# Patient Record
Sex: Male | Born: 1935 | Race: White | Hispanic: No | Marital: Married | Smoking: Former smoker
Health system: Southern US, Community
[De-identification: ages and names within clinical notes are randomized; demographics above are authoritative.]

## PROBLEM LIST (undated history)

## (undated) ENCOUNTER — Inpatient Hospital Stay: Payer: Self-pay | Admitting: Hematology and Oncology

## (undated) DIAGNOSIS — K219 Gastro-esophageal reflux disease without esophagitis: Secondary | ICD-10-CM

## (undated) DIAGNOSIS — R0602 Shortness of breath: Secondary | ICD-10-CM

## (undated) DIAGNOSIS — J189 Pneumonia, unspecified organism: Secondary | ICD-10-CM

## (undated) DIAGNOSIS — R5383 Other fatigue: Secondary | ICD-10-CM

## (undated) DIAGNOSIS — R51 Headache: Secondary | ICD-10-CM

## (undated) DIAGNOSIS — J45909 Unspecified asthma, uncomplicated: Secondary | ICD-10-CM

## (undated) DIAGNOSIS — G4730 Sleep apnea, unspecified: Secondary | ICD-10-CM

## (undated) DIAGNOSIS — K279 Peptic ulcer, site unspecified, unspecified as acute or chronic, without hemorrhage or perforation: Secondary | ICD-10-CM

## (undated) DIAGNOSIS — I2510 Atherosclerotic heart disease of native coronary artery without angina pectoris: Secondary | ICD-10-CM

## (undated) DIAGNOSIS — N400 Enlarged prostate without lower urinary tract symptoms: Secondary | ICD-10-CM

## (undated) DIAGNOSIS — D72819 Decreased white blood cell count, unspecified: Secondary | ICD-10-CM

## (undated) DIAGNOSIS — N189 Chronic kidney disease, unspecified: Secondary | ICD-10-CM

## (undated) DIAGNOSIS — I10 Essential (primary) hypertension: Secondary | ICD-10-CM

## (undated) DIAGNOSIS — D539 Nutritional anemia, unspecified: Secondary | ICD-10-CM

## (undated) DIAGNOSIS — E538 Deficiency of other specified B group vitamins: Secondary | ICD-10-CM

## (undated) DIAGNOSIS — Z8719 Personal history of other diseases of the digestive system: Secondary | ICD-10-CM

## (undated) DIAGNOSIS — I499 Cardiac arrhythmia, unspecified: Secondary | ICD-10-CM

## (undated) DIAGNOSIS — D649 Anemia, unspecified: Secondary | ICD-10-CM

## (undated) DIAGNOSIS — J449 Chronic obstructive pulmonary disease, unspecified: Secondary | ICD-10-CM

## (undated) DIAGNOSIS — H409 Unspecified glaucoma: Secondary | ICD-10-CM

## (undated) DIAGNOSIS — M1990 Unspecified osteoarthritis, unspecified site: Secondary | ICD-10-CM

## (undated) DIAGNOSIS — E7800 Pure hypercholesterolemia, unspecified: Secondary | ICD-10-CM

## (undated) HISTORY — DX: Decreased white blood cell count, unspecified: D72.819

## (undated) HISTORY — PX: EYE SURGERY: SHX253

## (undated) HISTORY — PX: NASAL POLYP SURGERY: SHX186

## (undated) HISTORY — DX: Atherosclerotic heart disease of native coronary artery without angina pectoris: I25.10

## (undated) HISTORY — PX: TRANSURETHRAL RESECTION OF PROSTATE: SHX73

## (undated) HISTORY — DX: Other fatigue: R53.83

## (undated) HISTORY — DX: Benign prostatic hyperplasia without lower urinary tract symptoms: N40.0

## (undated) HISTORY — DX: Nutritional anemia, unspecified: D53.9

## (undated) HISTORY — DX: Deficiency of other specified B group vitamins: E53.8

---

## 1997-05-14 DIAGNOSIS — I2510 Atherosclerotic heart disease of native coronary artery without angina pectoris: Secondary | ICD-10-CM

## 1997-05-14 HISTORY — DX: Atherosclerotic heart disease of native coronary artery without angina pectoris: I25.10

## 1998-08-09 ENCOUNTER — Ambulatory Visit (HOSPITAL_COMMUNITY): Admission: RE | Admit: 1998-08-09 | Discharge: 1998-08-09 | Payer: Self-pay

## 1999-10-31 ENCOUNTER — Encounter: Payer: Self-pay | Admitting: Otolaryngology

## 1999-10-31 ENCOUNTER — Encounter: Admission: RE | Admit: 1999-10-31 | Discharge: 1999-10-31 | Payer: Self-pay | Admitting: Otolaryngology

## 1999-11-02 ENCOUNTER — Encounter (INDEPENDENT_AMBULATORY_CARE_PROVIDER_SITE_OTHER): Payer: Self-pay | Admitting: *Deleted

## 1999-11-02 ENCOUNTER — Ambulatory Visit (HOSPITAL_BASED_OUTPATIENT_CLINIC_OR_DEPARTMENT_OTHER): Admission: RE | Admit: 1999-11-02 | Discharge: 1999-11-02 | Payer: Self-pay | Admitting: Otolaryngology

## 1999-12-06 ENCOUNTER — Ambulatory Visit (HOSPITAL_COMMUNITY): Admission: RE | Admit: 1999-12-06 | Discharge: 1999-12-07 | Payer: Self-pay | Admitting: Cardiovascular Disease

## 1999-12-06 HISTORY — PX: CORONARY ANGIOPLASTY: SHX604

## 1999-12-20 ENCOUNTER — Inpatient Hospital Stay (HOSPITAL_COMMUNITY): Admission: EM | Admit: 1999-12-20 | Discharge: 1999-12-22 | Payer: Self-pay | Admitting: *Deleted

## 1999-12-21 ENCOUNTER — Encounter: Payer: Self-pay | Admitting: Internal Medicine

## 2003-03-21 ENCOUNTER — Emergency Department (HOSPITAL_COMMUNITY): Admission: EM | Admit: 2003-03-21 | Discharge: 2003-03-21 | Payer: Self-pay | Admitting: Emergency Medicine

## 2007-08-29 ENCOUNTER — Ambulatory Visit: Payer: Self-pay | Admitting: Cardiovascular Disease

## 2007-09-02 ENCOUNTER — Ambulatory Visit: Payer: Self-pay

## 2009-05-14 HISTORY — PX: ESOPHAGOGASTRODUODENOSCOPY: SHX1529

## 2009-07-04 ENCOUNTER — Encounter: Payer: Self-pay | Admitting: Gastroenterology

## 2009-07-07 ENCOUNTER — Encounter (INDEPENDENT_AMBULATORY_CARE_PROVIDER_SITE_OTHER): Payer: Self-pay | Admitting: *Deleted

## 2009-08-31 ENCOUNTER — Ambulatory Visit: Payer: Self-pay | Admitting: Gastroenterology

## 2009-08-31 DIAGNOSIS — H402290 Chronic angle-closure glaucoma, unspecified eye, stage unspecified: Secondary | ICD-10-CM | POA: Insufficient documentation

## 2009-08-31 DIAGNOSIS — I2510 Atherosclerotic heart disease of native coronary artery without angina pectoris: Secondary | ICD-10-CM | POA: Insufficient documentation

## 2009-08-31 DIAGNOSIS — D6489 Other specified anemias: Secondary | ICD-10-CM

## 2009-08-31 DIAGNOSIS — G4730 Sleep apnea, unspecified: Secondary | ICD-10-CM | POA: Insufficient documentation

## 2009-08-31 DIAGNOSIS — E119 Type 2 diabetes mellitus without complications: Secondary | ICD-10-CM

## 2009-09-12 ENCOUNTER — Telehealth: Payer: Self-pay | Admitting: Gastroenterology

## 2009-09-13 ENCOUNTER — Ambulatory Visit: Payer: Self-pay | Admitting: Gastroenterology

## 2009-09-15 ENCOUNTER — Encounter: Payer: Self-pay | Admitting: Gastroenterology

## 2009-09-21 ENCOUNTER — Telehealth: Payer: Self-pay | Admitting: Gastroenterology

## 2009-09-22 ENCOUNTER — Ambulatory Visit: Payer: Self-pay | Admitting: Gastroenterology

## 2009-09-22 ENCOUNTER — Ambulatory Visit: Payer: Self-pay | Admitting: Internal Medicine

## 2009-09-22 DIAGNOSIS — M1389 Other specified arthritis, multiple sites: Secondary | ICD-10-CM

## 2009-09-22 DIAGNOSIS — M129 Arthropathy, unspecified: Secondary | ICD-10-CM | POA: Insufficient documentation

## 2009-09-22 DIAGNOSIS — K625 Hemorrhage of anus and rectum: Secondary | ICD-10-CM

## 2009-09-22 LAB — CONVERTED CEMR LAB
Basophils Absolute: 0 10*3/uL (ref 0.0–0.1)
Basophils Relative: 0.4 % (ref 0.0–3.0)
Eosinophils Absolute: 0.1 10*3/uL (ref 0.0–0.7)
Eosinophils Relative: 2.2 % (ref 0.0–5.0)
HCT: 32.2 % — ABNORMAL LOW (ref 39.0–52.0)
Hemoglobin: 11 g/dL — ABNORMAL LOW (ref 13.0–17.0)
Lymphocytes Relative: 22.8 % (ref 12.0–46.0)
Lymphs Abs: 1.3 10*3/uL (ref 0.7–4.0)
MCHC: 34.1 g/dL (ref 30.0–36.0)
MCV: 96.3 fL (ref 78.0–100.0)
Monocytes Absolute: 0.7 10*3/uL (ref 0.1–1.0)
Monocytes Relative: 13.6 % — ABNORMAL HIGH (ref 3.0–12.0)
Neutro Abs: 3.4 10*3/uL (ref 1.4–7.7)
Neutrophils Relative %: 61 % (ref 43.0–77.0)
Platelets: 271 10*3/uL (ref 150.0–400.0)
RBC: 3.35 M/uL — ABNORMAL LOW (ref 4.22–5.81)
RDW: 13.5 % (ref 11.5–14.6)
WBC: 5.5 10*3/uL (ref 4.5–10.5)

## 2009-09-23 ENCOUNTER — Telehealth: Payer: Self-pay | Admitting: Nurse Practitioner

## 2009-10-04 ENCOUNTER — Telehealth: Payer: Self-pay | Admitting: Nurse Practitioner

## 2010-05-14 HISTORY — PX: COLONOSCOPY: SHX174

## 2010-06-13 NOTE — Letter (Signed)
Summary: Patient Notice-Hyperplastic Polyps  [facility]  [address] [place], [place] [address]   Phone: [phone]  Fax: [phone]        Sep 15, 2009 MRN: [id number]    Collin Campbell [address] [place], [place]  [address]    Dear [name],  I am pleased to inform you that the colon polyp(s) removed during your recent colonoscopy was (were) found to be hyperplastic.  These types of polyps are NOT pre-cancerous.  It is therefore my recommendation that you have a repeat colonoscopy examination in _ years for routine colorectal cancer screening.  Should you develop new or worsening symptoms of abdominal pain, bowel habit changes or bleeding from the rectum or bowels, please schedule an evaluation with either your primary care physician or with me.  Additional information/recommendations:  _x_No further action with gastroenterology is needed at this time.      Please follow-up with your primary care physician for your other      healthcare needs. __Please call [phone] to schedule a return visit to review      your situation.  __Please keep your follow-up visit as already scheduled.  __Continue treatment plan as outlined the day of your exam.  Please call us if you are having persistent problems or have questions about your condition that have not been fully answered at this time.  Sincerely,  Louis Meckel MD This letter has been electronically signed by your physician.  Appended Document: Patient Notice-Hyperplastic Polyps letter mailed 5.10.11

## 2010-06-13 NOTE — Progress Notes (Signed)
Summary: TRIAGE-Bleeding  Phone Note Call from Patient   Caller: Patient Call For: DR.Arend Bahl Summary of Call: Pt. had a Colon/Endo. on 09-13-09. He states he passes rectal blood everyday, states he has been bleeding since before his Colonoscopy. He declines an appointment.   [facility] PLEASE ADVISE  Initial call taken by: Laureen Ochs LPN,  Sep 21, 2009 3:49 PM  Follow-up for Phone Call        Pt. calling back, states he now has abd. pain/burning. He will come for labs and to see Willette Cluster NP 09-22-09 at 2pm. If symptoms become worse call back immediately or go to ER.  Follow-up by: Laureen Ochs LPN,  Sep 21, 2009 4:57 PM

## 2010-06-13 NOTE — Letter (Signed)
Summary: Results Letter  [facility]  [address] [place], [place] [address]   Phone: [phone]  Fax: [phone]        August 31, 2009 MRN: [id number]    Collin Campbell [address] [place], [place]  [address]    Dear Mr. KOHLMEYER,  It is my pleasure to have treated you recently as a new patient in my office. I appreciate your confidence and the opportunity to participate in your care.  Since I do have a busy inpatient endoscopy schedule and office schedule, my office hours vary weekly. I am, however, available for emergency calls everyday through my office. If I am not available for an urgent office appointment, another one of our gastroenterologist will be able to assist you.  My well-trained staff are prepared to help you at all times. For emergencies after office hours, a physician from our Gastroenterology section is always available through my 24 hour answering service  Once again I welcome you as a new patient and I look forward to a happy and healthy relationship             Sincerely,  Louis Meckel MD  This letter has been electronically signed by your physician.  Appended Document: Results Letter mailed

## 2010-06-13 NOTE — Procedures (Signed)
Summary: Upper Endoscopy  Patient: Collin Campbell Note: All result statuses are Final unless otherwise noted.  Tests: (1) Upper Endoscopy (EGD)   EGD Upper Endoscopy       DONE     [facility]     [address]     [place], [place]  [address]           ENDOSCOPY PROCEDURE REPORT           PATIENT:  Collin Campbell, Collin Campbell  MR#:  [id number]     BIRTHDATE:  1926-02-23, 83 yrs. old  GENDER:  male           ENDOSCOPIST:  Barbette Hair. Arlyce Dice, MD     Referred by:           PROCEDURE DATE:  09/13/2009     PROCEDURE:  EGD, diagnostic     ASA CLASS:  Class III     INDICATIONS:  anemia Pt takes mobic regularly           MEDICATIONS:   There was residual sedation effect present from     prior procedure., Versed 1 mg IV, glycopyrrolate (Robinal) 0.2 mg     IV, 0.6cc simethancone 0.6 cc PO     TOPICAL ANESTHETIC:  Exactacain Spray           DESCRIPTION OF PROCEDURE:   After the risks benefits and     alternatives of the procedure were thoroughly explained, informed     consent was obtained.  The LB GIF-H180 [id number] endoscope was     introduced through the mouth and advanced to the third portion of     the duodenum, without limitations.  The instrument was slowly     withdrawn as the mucosa was fully examined.     <<PROCEDUREIMAGES>>           The upper, middle, and distal third of the esophagus were     carefully inspected and no abnormalities were noted. The z-line     was well seen at the GEJ. The endoscope was pushed into the fundus     which was normal including a retroflexed view. The antrum,gastric     body, first and second part of the duodenum were unremarkable (see     image1, image2, image3, image4, image6, image7, and image9).     Retroflexed views revealed no abnormalities.    The scope was then     withdrawn from the patient and the procedure completed.     COMPLICATIONS:  None           ENDOSCOPIC IMPRESSION:     1) Normal EGD           RECOMMENDATIONS:Okay to  continue mobic, provided that blood counts     remain stable (pt tested hemeoccult negative)           REPEAT EXAM:  No           ______________________________     Barbette Hair. Arlyce Dice, MD           CC:  Rudi Heap, MD           n.     Rosalie Doctor:   Barbette Hair. Fleetwood Pierron at 09/13/2009 03:57 PM           Judith Blonder, [phone]  Note: An exclamation mark (!) indicates a result that was not dispersed into the flowsheet. Document Creation Date: 09/13/2009 3:57 PM _______________________________________________________________________  (1) Order result status: Final Collection or observation  date-time: 09/13/2009 15:41 Requested date-time:  Receipt date-time:  Reported date-time:  Referring Physician:   Ordering Physician: Melvia Heaps [phone]) Specimen Source:  Source: Launa Grill Order Number: [id number] Lab site:   Appended Document: Upper Endoscopy

## 2010-06-13 NOTE — Progress Notes (Signed)
Summary: Medication  Phone Note Call from Patient Call back at Home Phone [phone]   Caller: Patient Call For: Willette Cluster Reason for Call: Talk to Nurse Summary of Call: Update: Pt wanted to update his meds. He is taking Diovan HCT 320/25mg . Initial call taken by: Karna Christmas,  Sep 23, 2009 10:04 AM  Follow-up for Phone Call        I changed this on his med list. Follow-up by: Joselyn Glassman,  Sep 23, 2009 3:43 PM    New/Updated Medications: * DIOVAN HCT 320/25 MG 1 by mouth once daily

## 2010-06-13 NOTE — Progress Notes (Signed)
Summary: Returning Pam's call  Phone Note Call from Patient Call back at Home Phone [phone]   Call For: Collin Cluster, NP Summary of Call: Says he is returning Pam's call Initial call taken by: Leanor Kail [facility],  Oct 04, 2009 10:06 AM  Follow-up for Phone Call        The pt is doing well. He has had no bleeding and has used the 1 perscription for the Anusol Suppositories.  He is taking Miralax one time daily at bedtime.  He sometimes only has a small BM.  I told him to use it twice a day for 2-3 days.  He thanked me for calling and will have an appt with her PCP , Collin Agee, NP or Collin Campbell.  I asked him to have them fax his lab results to [place]. Follow-up by: Joselyn Glassman,  Oct 04, 2009 11:31 AM

## 2010-06-13 NOTE — Assessment & Plan Note (Signed)
Summary: ABD.PAIN/RECTAL BLEEDING        (DR.KAPLAN PT.)    Collin Campbell   History of Present Illness Visit Type: Follow-up Visit Primary GI MD: Melvia Heaps MD [facility] Primary Provider: Rudi Heap, MD Requesting Provider: Rudi Heap, MD Chief Complaint: Abdominal pain and rectal bleeding History of Present Illness:   Patient is an 77 year old male here for rectal bleeding. Seen 09/07/09 for several GI problems including a recent episode of rectal bleeding, constipation, diarrhea, and anemia. EGD and colonoscopy 09/13/09 were unremarkable. Patient had no further rectal bleeding (since the one episode he had prior to 09/07/09 appt.) until a few days ago. Since colonoscopy he has had 2-3 episodes of low volume bleeding. No rectal or abdominal pain. No dizziness or chest pain. He has lower back pain when bending over.   GI Review of Systems    Reports bloating.      Denies abdominal pain, acid reflux, belching, chest pain, dysphagia with liquids, dysphagia with solids, heartburn, loss of appetite, nausea, vomiting, vomiting blood, weight loss, and  weight gain.      Reports hemorrhoids and  rectal bleeding.     Denies anal fissure, black tarry stools, change in bowel habit, constipation, diarrhea, diverticulosis, fecal incontinence, heme positive stool, irritable bowel syndrome, jaundice, light color stool, liver problems, and  rectal pain.    Current Medications (verified): 1)  Multivitamins  Tabs (Multiple Vitamin) .... Once Daily 2)  Aspirin 325 Mg Tabs (Aspirin) .... Once Daily 3)  Meloxicam 7.5 Mg Tabs (Meloxicam) .... Take 1 Tablet By Mouth Once Daily 4)  Pantoprazole Sodium 40 Mg Tbec (Pantoprazole Sodium) .... One Tablet By Mouth Once Daily 5)  Fenofibrate 160 Mg Tabs (Fenofibrate) .... Take 1 Tablet By Mouth Once Daily 6)  Exforge Hct 10-160-12.5 Mg Tabs (Amlodipine-Valsartan-Hctz) .... One Tablet By Mouth Once Daily 7)  Metformin Hcl 500 Mg Tabs (Metformin Hcl) .... Take 1 Tablet By Mouth  Two Times A Day 8)  Lipitor 40 Mg Tabs (Atorvastatin Calcium) .... Take 1 Tablet By Mouth Once Daily 9)  Albuterol Sulfate (2.5 Mg/4ml) 0.083% Nebu (Albuterol Sulfate) .... As Needed 10)  Proair Hfa 108 (90 Base) Mcg/act Aers (Albuterol Sulfate) .... As Needed 11)  Vitamin D (Ergocalciferol) 50000 Unit Caps (Ergocalciferol) .... Once Weekly 12)  Niaspan 1000 Mg Cr-Tabs (Niacin (Antihyperlipidemic)) .... One Tablet By Mouth Once Daily 13)  Symbicort 80-4.5 Mcg/act Aero (Budesonide-Formoterol Fumarate) .... As Directed 14)  Jalyn 0.5-0.4 Mg Caps (Dutasteride-Tamsulosin Hcl) .... One Capsule By Mouth Once Daily 15)  Bystolic 10 Mg Tabs (Nebivolol Hcl) .... One Tablet By Mouth Once Daily 16)  Diovan Hct (Unsure of Strength) .Marland Kitchen.. 1 By Mouth Once Daily  Allergies (verified): 1)  ! Sulfa  Past History:  Past Medical History: Coronary Artery Disease Diabetes GERD Hyperlipidemia Hypertension COPD Arthritis Asthma Glaucoma Obesity Peptic Ulcer Disease Pneumonia Sleep Apnea Chronic Headaches Hemorrhoids  Past Surgical History: Reviewed history from 07/29/2009 and no changes required. PTCA-Stent 2001 Cataract Extraction Deviated Septum TURP  Family History: Reviewed history from 08/31/2009 and no changes required. Family History of Colon Cancer: Younger Brother Family History of Diabetes: Daughter  Family History of Heart Disease: Father   Social History: Reviewed history from 08/31/2009 and no changes required. Occupation: Retired Married 6 childern  Patient is a former smoker. quit 40 years ago Alcohol Use - yes  Daily Caffeine Use: 2-3 daily  Illicit Drug Use - no  Review of Systems       The patient  complains of swelling of feet/legs.  The patient denies allergy/sinus, anemia, anxiety-new, arthritis/joint pain, back pain, blood in urine, breast changes/lumps, change in vision, confusion, cough, coughing up blood, depression-new, fainting, fatigue, fever,  headaches-new, hearing problems, heart murmur, heart rhythm changes, itching, muscle pains/cramps, night sweats, nosebleeds, shortness of breath, skin rash, sleeping problems, sore throat, swollen lymph glands, thirst - excessive, urination - excessive, urination changes/pain, urine leakage, vision changes, and voice change.    Vital Signs:  Patient profile:   77 year old male Height:      66 inches Weight:      207.25 pounds BMI:     33.57 Pulse rate:   68 / minute BP sitting:   148 / 78  (left arm)  Vitals Entered By: Milford Cage NCMA (Sep 22, 2009 2:16 PM)  Physical Exam  General:  Well developed, well nourished, no acute distress. Head:  Normocephalic and atraumatic. Eyes:  Conjunctiva pink, no icterus.  Mouth:  No oral lesions. Tongue moist.  Neck:  no obvious masses  Lungs:  Clear throughout to auscultation. Heart:  Soft murmur. Occasional irregular beat. . Abdomen:  Abdomen soft, nontender, nondistended. No obvious masses or hepatomegaly.Normal bowel sounds.  Rectal:  Several large, inflamed protruding hemorrhoids. No fissures seen.  Extremities:  No palmar erythema, no edema.  Neurologic:  Alert and  oriented x4;  grossly normal neurologically. Skin:  Intact without significant lesions or rashes. Cervical Nodes:  No significant cervical adenopathy. Psych:  Alert and cooperative. Normal mood and affect.   Impression & Recommendations:  Problem # 1:  RECTAL BLEEDING (ICD-569.3) Assessment Deteriorated Recurrent low volume rectal bleeding. Last episode was yesterday with BM. Three normal BMs today. Unremarkable EGD and colonoscopy several days ago (done for anemia and several GI complaints). Several large, inflamed protruding hemorrhoids on exam. Inflamed hemorrhoids on internal exam. Start Anusol HC suppositories twice daily for 10 days. Avoid constipation by continuing high fiber diet and may also use Miralax as needed. Patient will call in 7-10 days to give condition  update.   Problem # 2:  ADENOCARCINOMA, COLON, FAMILY HX (ICD-V16.0) Assessment: Comment Only Just had full colonoscopy on 09/13/09 with removal of only a hyperplastic polyp.  Problem # 3:  OTHER SPECIFIED ANEMIAS (ICD-285.8) Stable. Hgb today is 11.0.  Dr. Arlyce Dice has deferred any further workup to PCP.   Problem # 4:  ARTHRITIS (ICD-716.90) Assessment: Comment Only Meloxicam was restarted after endoscopic workup.  Patient Instructions: 1)  We sent perscription for Anusol Suppositories  to [facility].  2)  Use Miralax 1-2 times daily, 1 scoopfil in a glass of water to avoid constipation. 3)  Call Pam in 10 days, we would like to know if y ou have had any more bleeding. 4)  Copy sent to : Paulene Floor, MD 5)  The medication list was reviewed and reconciled.  All changed / newly prescribed medications were explained.  A complete medication list was provided to the patient / caregiver. Prescriptions: ANUSOL-HC 25 MG SUPP (HYDROCORTISONE ACETATE) Use 1 suppository twice daily for 10 days  #10 x 1   Entered by:   Lowry Ram NCMA   Authorized by:   Willette Cluster NP   Signed by:   Lowry Ram NCMA on 09/22/2009   Method used:   Electronically to        [facility] (retail)       [address]  [place], [place]  [address]       Ph: [phone]       Fax: [phone]   RxID:   [id number]   Appended Document: ABD.PAIN/RECTAL BLEEDING        (DR.KAPLAN PT.)    Collin Campbell Pt called to report he is doing better.  He is taking generic Miralax once daily.  I told him to use it 1-2 times if he feels really constipated.  He had a spell of chills and vomiting yesterday and is fine today.  He has had no more rectal bleeding since he saw Willette Cluster NP on 09-22-09. I told him to call us if his symptoms worsen.

## 2010-06-13 NOTE — Letter (Signed)
Summary: New Patient letter  [facility]  [address] [place], [place] [address]   Phone: [phone]  Fax: [phone]       07/07/2009 MRN: [id number]  Collin Campbell [address] [place], [place]  [address]  Dear Mr. GAPINSKI,  Welcome to the Gastroenterology Division at [facility].    You are scheduled to see Dr. Arlyce Dice on 08-03-09 at 11:00a.m. on the 3rd floor at [facility], [address]  We ask that you try to arrive at our office 15 minutes prior to your appointment time to allow for check-in.  We would like you to complete the enclosed self-administered evaluation form prior to your visit and bring it with you on the day of your appointment.  We will review it with you.  Also, please bring a complete list of all your medications or, if you prefer, bring the medication bottles and we will list them.  Please bring your insurance card so that we may make a copy of it.  If your insurance requires a referral to see a specialist, please bring your referral form from your primary care physician.  Co-payments are due at the time of your visit and may be paid by cash, check or credit card.     Your office visit will consist of a consult with your physician (includes a physical exam), any laboratory testing he/she may order, scheduling of any necessary diagnostic testing (e.g. x-ray, ultrasound, CT-scan), and scheduling of a procedure (e.g. Endoscopy, Colonoscopy) if required.  Please allow enough time on your schedule to allow for any/all of these possibilities.    If you cannot keep your appointment, please call [phone] to cancel or reschedule prior to your appointment date.  This allows [place] the opportunity to schedule an appointment for another patient in need of care.  If you do not cancel or reschedule by 5 p.m. the business day prior to your appointment date, you will be charged a $50.00 late cancellation/no-show fee.    Thank you for choosing  [facility] for your medical needs.  We appreciate the opportunity to care for you.  Please visit [place] at our website  to learn more about our practice.                     Sincerely,                                                             The Gastroenterology Division

## 2010-06-13 NOTE — Progress Notes (Signed)
Summary: ? re meds  Phone Note Call from Patient Call back at Home Phone [phone]   Caller: Patient Call For: Arlyce Dice Reason for Call: Talk to Nurse Summary of Call: Patient has questions regarding medication before his procedure tomorrow. Initial call taken by: Tawni Levy,  Sep 12, 2009 4:22 PM  Follow-up for Phone Call        pt. had a question about diabetic medication. instructed pt. to take medication  as he usually do tonight but do not take diabetic medication tomarrow. pt. vebalize understanding. Follow-up by: Greer Ee RN,  Sep 12, 2009 4:34 PM

## 2010-06-13 NOTE — Letter (Signed)
Summary: [facility] Instructions  [facility]  [address] [place], [place] [address]   Phone: [phone]  Fax: [phone]       MANDY FITZWATER    March 28, 1926    MRN: [id number]        Procedure Day /Date:TUESDAY 09/13/2009     Arrival Time:2:30PM     Procedure Time:3:30PM     Location of Procedure:                    X   [facility] (4th Floor)   PREPARATION FOR COLONOSCOPY WITH MOVIPREP/egd   Starting 5 days prior to your procedure 4/28/2011do not eat nuts, seeds, popcorn, corn, beans, peas,  salads, or any raw vegetables.  Do not take any fiber supplements (e.g. Metamucil, Citrucel, and Benefiber).  THE DAY BEFORE YOUR PROCEDURE         DATE:09/12/2009  DAY: MONDAY  1.  Drink clear liquids the entire day-NO SOLID FOOD  2.  Do not drink anything colored red or purple.  Avoid juices with pulp.  No orange juice.  3.  Drink at least 64 oz. (8 glasses) of fluid/clear liquids during the day to prevent dehydration and help the prep work efficiently.  CLEAR LIQUIDS INCLUDE: Water Jello Ice Popsicles Tea (sugar ok, no milk/cream) Powdered fruit flavored drinks Coffee (sugar ok, no milk/cream) Gatorade Juice: apple, white grape, white cranberry  Lemonade Clear bullion, consomm, broth Carbonated beverages (any kind) Strained chicken noodle soup Hard Candy                             4.  In the morning, mix first dose of MoviPrep solution:    Empty 1 Pouch A and 1 Pouch B into the disposable container    Add lukewarm drinking water to the top line of the container. Mix to dissolve    Refrigerate (mixed solution should be used within 24 hrs)  5.  Begin drinking the prep at 5:00 p.m. The MoviPrep container is divided by 4 marks.   Every 15 minutes drink the solution down to the next mark (approximately 8 oz) until the full liter is complete.   6.  Follow completed prep with 16 oz of clear liquid of your choice (Nothing red or purple).   Continue to drink clear liquids until bedtime.  7.  Before going to bed, mix second dose of MoviPrep solution:    Empty 1 Pouch A and 1 Pouch B into the disposable container    Add lukewarm drinking water to the top line of the container. Mix to dissolve    Refrigerate  THE DAY OF YOUR PROCEDURE      DATE: 09/13/2009 DAY: TUESDAY  Beginning at 10:30a.m. (5 hours before procedure):         1. Every 15 minutes, drink the solution down to the next mark (approx 8 oz) until the full liter is complete.  2. Follow completed prep with 16 oz. of clear liquid of your choice.    3. You may drink clear liquids until 1:30PM (2 HOURS BEFORE PROCEDURE).   MEDICATION INSTRUCTIONS  Unless otherwise instructed, you should take regular prescription medications with a small sip of water   as early as possible the morning of your procedure.  Diabetic patients - see separate instructions.           OTHER INSTRUCTIONS  You will need a responsible adult at least  75 years of age to accompany you and drive you home.   This person must remain in the waiting room during your procedure.  Wear loose fitting clothing that is easily removed.  Leave jewelry and other valuables at home.  However, you may wish to bring a book to read or  an iPod/MP3 player to listen to music as you wait for your procedure to start.  Remove all body piercing jewelry and leave at home.  Total time from sign-in until discharge is approximately 2-3 hours.  You should go home directly after your procedure and rest.  You can resume normal activities the  day after your procedure.  The day of your procedure you should not:   Drive   Make legal decisions   Operate machinery   Drink alcohol   Return to work  You will receive specific instructions about eating, activities and medications before you leave.    The above instructions have been reviewed and explained to me by   _______________________    I fully  understand and can verbalize these instructions _____________________________ Date _________

## 2010-06-13 NOTE — Assessment & Plan Note (Signed)
Summary: low hemoglobin/discuss colon--ch   History of Present Illness Visit Type: consult  Primary GI MD: Melvia Heaps MD [facility] Primary Provider: Rudi Heap, MD Requesting Provider: Rudi Heap, MD Chief Complaint: Upper abd pain, acid reflux, heartburn, diarrhea, constipation, rectal bleeding, and rectal pain  History of Present Illness:   Collin Campbell is a pleasant 77 year old white male referred at the request of Dr. Christell Constant for evaluation of anemia.  On routine testing in [place], 2011 hemoglobin was 11.3 MCV 98.8.   He apparently tested Hemoccult negative.  He has no GI complaints including abdominal pain,  melena or hematochezia.  Bowel habits have changed very slightly whereby he has more frequent small volume stools.  He takes Mobic daily.  Family history is pertinent for his brother who had colon cancer.  He had a sigmoidoscopy approximately one year ago.  He is has not had colonoscopy.  Patient has CAD,  diabetes,  COPD, glaucoma, and sleep apnea.   GI Review of Systems    Reports abdominal pain, acid reflux, belching, bloating, heartburn, and  vomiting blood.     Location of  Abdominal pain: upper abdomen.    Denies chest pain, dysphagia with liquids, dysphagia with solids, loss of appetite, nausea, vomiting, weight loss, and  weight gain.      Reports black tarry stools, change in bowel habits, constipation, diarrhea, hemorrhoids, irritable bowel syndrome, rectal bleeding, and  rectal pain.     Denies anal fissure, diverticulosis, fecal incontinence, heme positive stool, jaundice, light color stool, and  liver problems.    Current Medications (verified): 1)  Multivitamins  Tabs (Multiple Vitamin) .... Once Daily 2)  Aspirin 325 Mg Tabs (Aspirin) .... Once Daily 3)  Meloxicam 7.5 Mg Tabs (Meloxicam) .... Take 1 Tablet By Mouth Once Daily 4)  Pantoprazole Sodium 40 Mg Tbec (Pantoprazole Sodium) .... One Tablet By Mouth Once Daily 5)  Fenofibrate 160 Mg Tabs (Fenofibrate) ....  Take 1 Tablet By Mouth Once Daily 6)  Exforge Hct 10-160-12.5 Mg Tabs (Amlodipine-Valsartan-Hctz) .... One Tablet By Mouth Once Daily 7)  Metformin Hcl 500 Mg Tabs (Metformin Hcl) .... Take 1 Tablet By Mouth Two Times A Day 8)  Lipitor 40 Mg Tabs (Atorvastatin Calcium) .... Take 1 Tablet By Mouth Once Daily 9)  Albuterol Sulfate (2.5 Mg/74ml) 0.083% Nebu (Albuterol Sulfate) .... As Needed 10)  Proair Hfa 108 (90 Base) Mcg/act Aers (Albuterol Sulfate) .... As Needed 11)  Vitamin D (Ergocalciferol) 50000 Unit Caps (Ergocalciferol) .... Once Weekly 12)  Niaspan 1000 Mg Cr-Tabs (Niacin (Antihyperlipidemic)) .... One Tablet By Mouth Once Daily 13)  Symbicort 80-4.5 Mcg/act Aero (Budesonide-Formoterol Fumarate) .... As Directed 14)  Jalyn 0.5-0.4 Mg Caps (Dutasteride-Tamsulosin Hcl) .... One Capsule By Mouth Once Daily 15)  Bystolic 10 Mg Tabs (Nebivolol Hcl) .... One Tablet By Mouth Once Daily  Allergies (verified): 1)  ! Sulfa  Past History:  Past Medical History: Coronary Artery Disease Diabetes GERD Hyperlipidemia Hypertension COPD Arthritis Asthma Glaucoma Obesity Peptic Ulcer Disease Pneumonia Sleep Apnea Chronic Headaches  Past Surgical History: Reviewed history from 07/29/2009 and no changes required. PTCA-Stent 2001 Cataract Extraction Deviated Septum TURP  Family History: Family History of Colon Cancer: Younger Brother Family History of Diabetes: Daughter  Family History of Heart Disease: Father   Social History: Occupation: Retired Married 6 childern  Patient is a former smoker. quit 40 years ago Alcohol Use - yes  Daily Caffeine Use: 2-3 daily  Illicit Drug Use - no Drug Use:  no  Review of  Systems       The patient complains of allergy/sinus, anxiety-new, arthritis/joint pain, back pain, fatigue, headaches-new, hearing problems, heart rhythm changes, muscle pains/cramps, night sweats, shortness of breath, sleeping problems, and swelling of feet/legs.   The patient denies anemia, blood in urine, breast changes/lumps, change in vision, confusion, cough, coughing up blood, depression-new, fainting, fever, heart murmur, itching, nosebleeds, skin rash, sore throat, swollen lymph glands, thirst - excessive, urination - excessive, urination changes/pain, urine leakage, vision changes, and voice change.         All other systems were reviewed and were negative   Vital Signs:  Patient profile:   77 year old male Height:      66 inches Weight:      214 pounds BMI:     34.67 BSA:     2.06 Pulse rate:   60 / minute Pulse rhythm:   regular BP sitting:   132 / 64  (left arm) Cuff size:   regular  Vitals Entered By: Ok Anis CMA (August 31, 2009 10:43 AM)  Physical Exam  Additional Exam:  On physical exam he is a well-developed well-nourished male  skin: anicteric HEENT: normocephalic; PEERLA; no nasal or pharyngeal abnormalities neck: supple nodes: no cervical lymphadenopathy chest: clear to ausculatation and percussion heart: no murmurs, gallops, or rubs abd: soft, nontender; BS normoactive; no abdominal masses, tenderness, organomegaly rectal: deferred ext: no cynanosis, clubbing, edema skeletal: no deformities neuro: oriented x 3; no focal abnormalities    Impression & Recommendations:  Problem # 1:  OTHER SPECIFIED ANEMIAS (ICD-285.8)  Patient may have a low-grade GI bleed related to his NSAID use.  Other GI sources for blood loss including polyps,  AVM or neoplasm are considerations.  Recommendations #1 upper endoscopy #2 colonoscopy  Risks, alternatives, and complications of the procedure, including bleeding, perforation, and possible need for surgery, were explained to the patient.  Patient's questions were answered.  Orders: Colon/Endo (Colon/Endo)  Problem # 2:  ADENOCARCINOMA, COLON, FAMILY HX (ICD-V16.0)  Plan colonoscopy  Orders: Colon/Endo (Colon/Endo)  Problem # 3:  CAD (ICD-414.00) Assessment: Comment  Only  Problem # 4:  DM (ICD-250.00) Assessment: Comment Only  Problem # 5:  CHRONIC ANGLE-CLOSURE GLAUCOMA (ICD-365.23) Assessment: Comment Only  Problem # 6:  SLEEP APNEA (ICD-780.57) Assessment: Comment Only  Patient Instructions: 1)  Copy sent to : Dorinda Hill Moore,MD 2)  Colonoscopy and Flexible Sigmoidoscopy brochure given.  3)  Conscious Sedation brochure given.  4)  Upper Endoscopy brochure given.  5)  You can pick up your MoviPrep from your pharmacy today 6)  The medication list was reviewed and reconciled.  All changed / newly prescribed medications were explained.  A complete medication list was provided to the patient / caregiver. Prescriptions: MOVIPREP 100 GM  SOLR (PEG-KCL-NACL-NASULF-NA ASC-C) As per prep instructions.  #1 x 0   Entered by:   Merri Ray CMA (AAMA)   Authorized by:   Louis Meckel MD   Signed by:   Merri Ray CMA (AAMA) on 08/31/2009   Method used:   Electronically to        [facility] (retail)       [address]       [place], [place]  [address]       Ph: [phone]       Fax: [phone]   RxID:   [id number]

## 2010-06-13 NOTE — Letter (Signed)
Summary: Diabetic Instructions  [facility]  [address] [place], [place] [address]   Phone: [phone]  Fax: [phone]    Collin Campbell 07-Jul-1925 MRN: [id number]   X  ORAL DIABETIC MEDICATION INSTRUCTIONS                                           METFORMIN The day before your procedure:   Take your diabetic pill as you do normally  The day of your procedure:   Do not take your diabetic pill    We will check your blood sugar levels during the admission process and again in Recovery before discharging you home  ________________________________________________________________________    INSULIN (LONG ACTING) MEDICATION INSTRUCTIONS (Lantus, NPH, 70/30, Humulin, Novolin-N)                                             The day before your procedure:   Take  your regular evening dose    The day of your procedure:   Do not take your morning dose    _  _   INSULIN (SHORT ACTING) MEDICATION INSTRUCTIONS (Regular, Humulog, Novolog)   The day before your procedure:   Do not take your evening dose   The day of your procedure:   Do not take your morning dose   _  _   INSULIN PUMP MEDICATION INSTRUCTIONS  We will contact the physician managing your diabetic care for written dosage instructions for the day before your procedure and the day of your procedure.  Once we have received the instructions, we will contact you.

## 2010-06-13 NOTE — Procedures (Signed)
Summary: Colonoscopy  Patient: Collin Campbell Note: All result statuses are Final unless otherwise noted.  Tests: (1) Colonoscopy (COL)   COL Colonoscopy           DONE     [facility]     [address]     [place], [place]  [address]           COLONOSCOPY PROCEDURE REPORT           PATIENT:  Collin Campbell, Collin Campbell  MR#:  [id number]     BIRTHDATE:  May 07, 1926, 83 yrs. old  GENDER:  male           ENDOSCOPIST:  Barbette Hair. Arlyce Dice, MD     Referred by:           PROCEDURE DATE:  09/13/2009     PROCEDURE:  Colonoscopy with snare polypectomy     ASA CLASS:  Class III     INDICATIONS:  1) family history of colon cancer  2) anemia Sibling                 MEDICATIONS:   Fentanyl 75 mcg IV, Versed 6 mg IV           DESCRIPTION OF PROCEDURE:   After the risks benefits and     alternatives of the procedure were thoroughly explained, informed     consent was obtained.  Digital rectal exam was performed and     revealed perianal skin tags.   The LB CF-[id number] endoscope     was introduced through the anus and advanced to the cecum, which     was identified by both the appendix and ileocecal valve, without     limitations.  The quality of the prep was excellent, using     MoviPrep.  The instrument was then slowly withdrawn as the colon     was fully examined.     <<PROCEDUREIMAGES>>           FINDINGS:  A sessile polyp was found in the sigmoid colon. It was     3 mm in size. It was found 10 cm from the point of entry. Polyp     was snared without cautery. Retrieval was successful (see     image18). snare polyp  Scattered diverticula were found in the     ascending colon (see image2).  Moderate diverticulosis was found     in the sigmoid colon (see image15).  This was otherwise a normal     examination of the colon (see image1, image3, image4, image6,     image8, image9, image13, and image19).   Retroflexed views in the     rectum revealed no abnormalities.    The time to cecum  =  4.5     minutes. The scope was then withdrawn (time =  9.0  min) from the     patient and the procedure completed.           COMPLICATIONS:  None           ENDOSCOPIC IMPRESSION:     1) 3 mm sessile polyp in the sigmoid colon     2) Diverticula, scattered in the ascending colon     3) Moderate diverticulosis in the sigmoid colon     4) Otherwise normal examination     RECOMMENDATIONS:     1) Return to the care of your primary provider. GI follow up as     needed  REPEAT EXAM:  No due to age.           ______________________________     Barbette Hair. Arlyce Dice, MD           CC: Rudi Heap, MD           n.     Rosalie Doctor:   Barbette Hair. Kaplan at 09/13/2009 03:54 PM           Page 2 of 3   Yousaf, Sainato, [phone]  Note: An exclamation mark (!) indicates a result that was not dispersed into the flowsheet. Document Creation Date: 09/13/2009 3:54 PM _______________________________________________________________________  (1) Order result status: Final Collection or observation date-time: 09/13/2009 15:35 Requested date-time:  Receipt date-time:  Reported date-time:  Referring Physician:   Ordering Physician: Melvia Heaps [phone]) Specimen Source:  Source: Launa Grill Order Number: [id number] Lab site:

## 2010-09-04 ENCOUNTER — Encounter: Payer: Self-pay | Admitting: Nurse Practitioner

## 2010-09-04 DIAGNOSIS — I10 Essential (primary) hypertension: Secondary | ICD-10-CM

## 2010-09-04 DIAGNOSIS — E785 Hyperlipidemia, unspecified: Secondary | ICD-10-CM | POA: Insufficient documentation

## 2010-09-26 NOTE — Assessment & Plan Note (Signed)
[facility]                            CARDIOLOGY OFFICE NOTE   JULEN, Collin Campbell                    MRN:          [id number]  DATE:08/29/2007                            DOB:          07-22-1925    The patient is a referred for preoperative clearance for cataract  surgery.  Interestingly, he already had the right cataract done.  He  needs the left cataract done at the end of April.  Apparently, prior to  his last appointment he had some palpitations and an elevated blood  pressure.  The patient has previously been seen by Dr. Daleen Squibb.  However, I  did his heart catheterization in 2001 at which time he had TIMI 2 flow  down his LAD.  He had a 2.5 x 20 mm Maverick stent placed to the LAD by  Dr. Gerri Spore.  He had some residual disease in his diagonal and 30-40%  circumflex disease.   In talking to the patient, he has had some exertional dyspnea.  He  stopped smoking 40 years ago but clinically appears to have significant  COPD and emphysema.  He has inhalers and frequently uses a nebulizer  prior to going out.   As far as I can tell he has not had formal PFTs and has not seen our  lung doctors.   His risk factors continue to include hypertension and  hypercholesterolemia as well as diabetes.   The patient does not get chest pain.  I do not know if his dyspnea is an  anginal equivalent or solely from his COPD.  He does not notice  palpitations, although apparently he had some PVCs during his last visit  when his blood pressure was variable.  His daughter was with him and  seems to think that the patient's heart rate is inversely related to his  systolic BP and indeed, she appears right.  When his systolics are high  his heart rate seemed to be as low as 48 or 50 beats per minute.  Given  this and his wheezing, he is certainly not a candidate for beta blocker  therapy if we can avoid it.  His review of systems otherwise negative.   His past  medical history is otherwise remarkable for:  1. Hypertension.  2. Hyperlipidemia.  3. Diabetes treated orally.  4. History of reflux.  5. Coronary artery disease with previous stent placement in 2001.   He denies any allergies.   His medications include:  1. Multivitamins.  2. An aspirin a day.  3. Meloxicam 7.5 a day.  4. Protonix 40 a day.  5. Fenofibrate 160 a day.  6. Lisinopril/hydrochlorothiazide 40/25.  7. Metformin 500 b.i.d.  8. Lipitor 40 a day.  9. Albuterol.  10.Eyedrops.  11.He also has p.r.n. ProAir.   The patient's family history is noncontributory.   The patient is retired.  He quit smoking 40 years ago.  He does not  drink.  His daughter was with him today.  He is as active as he can be  but has significant arthritis and dyspnea from his COPD.   EXAM:  Remarkable for an overweight elderly white male in no distress.  He has audible wheezes.  Weight is 204, blood pressure 140/70, pulse is  80 and regular, afebrile, respiratory rate 18.  HEENT:  Unremarkable.  Carotids are normal without bruit.  No  lymphadenopathy, thyromegaly, or JVP elevation.  LUNGS:  Have good diaphragmatic motion with an end-expiratory wheezes.  S1, S2.  Normal heart sounds.  PMI normal.  ABDOMEN:  Benign.  Bowel sounds positive.  No AAA, no tenderness, no  hepatosplenomegaly or hepatojugular reflux.  DISTAL PULSES:  Intact, no edema.  NEURO:  Nonfocal.  SKIN:  Warm and dry.  No muscular weakness.   EKG shows sinus rhythm with right bundle branch block, nonspecific ST-T  wave changes.  He has Qs in leads III and F.  I compared this to an EKG  from August 20, 2000, and there is no change.   IMPRESSION:  1. Dyspnea related to chronic obstructive pulmonary disease.  May      benefit from formal PFTs and follow-up with pulmonary.  Continue      ProAir and nebulizer treatment as well as albuterol.  2. Preoperative clearance.  We will do a Myoview on the patient on      Monday.  He does  not look like he can walk on a treadmill.  He has      some bronchospasticity but I think he will be okay if he takes his      nebulizer and inhalers prior to having adenosine.  I prefer not to      use dobutamine on him since he may have increased coronary disease      over the last 8 years that he has not been evaluated and I      certainly do not want to cause any arrhythmias.  3. Hypertension, borderline control.  May need the addition of Norvasc      in the future.  Would avoid beta blockers given his relative      bradycardia and wheezing.  4. Hyperlipidemia.  Continue Lipitor 40 a day.  Lipid and liver      profile per [facility].  5. Cataracts.  Will likely be cleared for surgery so long as his      stress test is low-risk.  6. History of reflux.  Continue Protonix 40 a day.  Lipid and liver      profile.  Avoid late-night      meals and spicy foods.  Avoid omeprazole in case he needs to be on      Plavix or other antiplatelets in the future.     Noralyn Pick. Eden Emms, MD, [facility]  Electronically Signed    PCN/MedQ  DD: 08/29/2007  DT: 08/29/2007  Job #: [id number]   cc:   Bennie Pierini, NP

## 2010-09-29 NOTE — Cardiovascular Report (Signed)
[place]. [facility]  Patient:    Collin Campbell, Collin Campbell                      MRN: [id number] Proc. Date: 12/06/99 Attending:  Noralyn Pick. Eden Emms, M.D. [facility] CC:         Thomas C. Wall, M.D. [facility]             Dr. Zelphia Cairo                        Cardiac Catheterization  PROCEDURE:  Coronary arteriography.  INDICATIONS:  High risk Cardiolite study suggesting anterior apical ischemia.  RESULTS:  Left main coronary artery had a 30% discrete stenosis.  Left anterior descending artery was a diffusely diseased vessel.  There was 50% tubular narrowing proximally.  There was 95% multiple discrete lesions in the mid vessel.  The distal vessel was small and diffusely diseased.  The first diagonal branch was an extremely small artery.  The second diagonal branch was subtotally occluded.  The circumflex coronary artery had 30-40% multiple discrete lesions in the mid vessel and there was a single large obtuse marginal branch with a 40% discrete lesion.  The right coronary artery was dominant.  There are 20% multiple discrete lesions proximally and in the mid vessel.  The distal vessel has 40% multiple discrete lesions.  There was a small posterolateral branch.  RAO VENTRICULOGRAPHY:  RAO ventriculography revealed anterior apical wall hypokinesis.  The ejection fraction was in the 50% range.  There was no gradient across the aortic valve and no MR.  The aortic pressure was 180/80. LV pressure was 182/27.  Right heart catheterization was performed due to the patients marked shortness of breath with exertion.  Mean right atrial pressure was 16 mmHg.  PA pressure was 33/20.  Pulmonary capillary wedge pressure mean was 22 mmHg.  Cardiac output was 3.6 L per minute by Fick calculation.  IMPRESSION:  The patients exertional dyspnea is clearly an angina component. His left anterior descending is not ideal for bypass surgery.  It was reviewed with Daisey Must, M.D., and he  thought it would be reasonable to attempt PTCA and/or stenting.  I think we will be able to salvage the diagonal branch.  The patient was given IV nitroglycerin and Lasix in the lab to bring his pressures down.  Dr. Gerri Spore will proceed with angioplasty a little later today after the patients hemodynamics have been stabilized. DD:  12/06/99 TD:  12/07/99 Job: [id number]

## 2010-09-29 NOTE — Procedures (Signed)
[place]. [facility]  Patient:    Collin Campbell, Collin Campbell                      MRN: [id number] Proc. Date: 11/02/99 Attending:  Margit Banda. Jearld Fenton, M.D. CC:         [facility]                           Procedure Report  PREOPERATIVE DIAGNOSES:  Chronic sinusitis, nasal polyposis, and sinus polyposis, and deviated septum.  ANESTHESIA:  General endotracheal tube.  ESTIMATED BLOOD LOSS:  Approximately 100 cc.  INDICATIONS:  This 77 year old has had a long history of nasal obstruction, which has not responded to nasal steroid sprays or other medical therapy.  On examination, he had significant polyposis on the left side as well as some on the right side.  There was a significant deviation of his septum.  CT scan showed extensive polypoid changes in the sinuses bilaterally.  The patient was informed of the risks and benefits of the procedure, including bleeding, infection, CSF leak, change in his sense of smell, blindness, scarring of the sinuses, chronic crusting, and risk of the anesthetic.  All questions were answered, and consent was obtained.  DESCRIPTION OF PROCEDURE:  Patient taken to the operating room and placed in the supine position after adequate general endotracheal tube anesthesia, was prepped and draped in the usual sterile manner.  The InstaTrak system was placed on his head and calibrated.  The nose was injected with 1% lidocaine with 1:100,000 epinephrine in the septum and into the polyps bilaterally.  The left side was biopsied and sent for frozen section, which showed no evidence of inverting papilloma.  The microdebrider was used to remove the polyps on the left side, and this was extensive, significant polyps that completely encompassed the middle turbinate with complete polypoid degeneration of the middle turbinate.  This was removed with the microdebrider as well.  The ethmoid bulla was identified with the InstaTrak  direction as well as the antrostomy were identified.  The antrostomy was opened widely with a side-biting forceps.  There was significant polypoid material in the maxillary sinus, which was removed with the microdebrider with stripping of the mucosa. The ethmoid was opened with extensive polyp disease.  Dissection was carried from posterior to anterior with the microdebrider up into the nasofrontal duct, which was opened.  The middle turbinate was trimmed with the microdebrider, as there was really polypoid degeneration and very floppy middle turbinate requiring removal, otherwise significant lateralization was going to occur.  The ethmoid was packed with pledgets.  The septoplasty was then performed, making a right hemitransfixion incision, identifying the cartilage, and a mucoperichondrial and ostial flap was elevated.  The cartilage was divided about 1.5 cm posterior to the caudal strut.  There was significant deviation of the septum.  The cartilage was removed with the [facility] elevator after elevating the opposite flaps.  The Jansen-Middleton forceps were used to remove the deviated portion of the bony septum.  A 1 cm dorsal strut was left intact, and an inferior spur was removed with the Therapist, nutritional and the [employer] forceps.  This corrected the septal deflection. The right side could then be performed with the sinus surgery with removal of the polyps with the microdebrider from the nose.  Again, the middle turbinate was degenerated and was removed up to the attachment, leaving a small remnant for  anatomic identification.  The polyps were removed from the infundibulum, and the bulla was opened.  The antrostomy was opened widely with side-biting forceps.  Thick mucus and polyps were present in the maxillary sinus and were removed with the microdebrider.  The microdebrider was used to take the dissection from posterior to anterior, removing thick polyp material throughout the ethmoid.   This was done completely with InstaTrak guidance and checking it frequently on both sides to check position and thoroughness of cleaning of the polyps.  The nasofrontal duct was opened.  The septoplasty was closed with interrupted 4-0 chromic and a quilting 4-0 plain gut placed through the septum.  Kennedy packs soaked with bacitracin were placed into the nose and saline was injected.  The nasopharynx was cleaned out completely of all debris and blood.  There was good hemostasis present.  The ties were tied loosely across the columella of the packs.  The patient was awakened and brought to recovery in stable condition.  Counts correct. DD:  11/02/99 TD:  11/04/99 Job: [id number]

## 2010-09-29 NOTE — Cardiovascular Report (Signed)
. [facility]  Patient:    Collin Campbell, Collin Campbell                      MRN: [id number] Proc. Date: 12/06/99 Attending:  Daisey Must, M.D. [facility] CC:         Quita Skye. Artis Flock, M.D.             Thomas C. Wall, M.D. [facility]             Catheter lab                        Cardiac Catheterization  PROCEDURE:  Percutaneous transluminal coronary angioplasty with stent placement in the mid-LAD.  INDICATION:  Mr. Staubs is a 77 year old male who has been having exertional angina.  A stress Cardiolite revealed significant ischemia in the LAD distribution.  Cardiac catheterization done by Dr. Noralyn Pick. Eden Emms today revealed the presence of a 99% stenosis in the mid-LAD with TIMI II flow. There was also a subtotal occlusion of a first diagonal branch.  We opted to proceed with percutaneous intervention of the left anterior descending coronary artery.  DESCRIPTION OF PROCEDURE:  A pre-existing 6 French sheath in the right femoral artery was exchanged over wire for a 7 [place] sheath.  Heparin and Integrilin were administered per protocol.  We used a 7 [place] guiding catheter and a BMW wire.  Predilatation of the lesion was performed with a 2.5 by 20 mm Maverick balloon inflated to 6 and then 8 atmospheres.  We then placed a 2.5 by 24 mm AVES 660 stent.  This was deployed at 14 atmospheres.  Final angiographic images revealed an excellent result in the LAD with 0% residual stenosis and TIMI III flow.  Because the diagonal was a small diameter vessel with very long subtotal occlusion and collateral filling of the distal vessel, we opted to leave this vessel along.  COMPLICATIONS:  None.  RESULTS:  Successful PTCA with stent placement in the mid-LAD.  A 99% stenosis with TIMI II flow was reduced to 0% residual with TIMI III flow.  Integrilin will be continued for 28 hours.  Plavix will be administered for four weeks. DD:  12/06/99 TD:  12/07/99 Job:  [id number]

## 2010-09-29 NOTE — Discharge Summary (Signed)
[place]  Patient:    Collin Campbell, Collin Campbell                    MRN: [id number] Adm. Date:  [id number] Disc. Date: 12/22/99 Attending:  Pricilla Riffle Dictator:   Lavella Hammock, P.A. CC:         Dr. Fatima Blank in [place] C. Wall, M.D. [facility]   Referring Physician Discharge [place]  DATE OF BIRTH:  August 09, 1925  PROCEDURES:  Adenosine Cardiolite.  HOSPITAL COURSE:  Mr. Monger is a 77 year old male with a history of coronary artery disease who was at home on the day of admission and had sudden onset of presyncope and then syncope after taking his a.m. meds, drinking a hot cup of coffee, and getting into the shower.  He states that he feels that he was out for about a minute, and that he did completely loss consciousness. After he woke up, there was no dizziness, no nausea, no diaphoresis, and there was no chest pain or palpitations associated with the incident.  He had no incontinence.  He was admitted to rule out MI and for further evaluation. There was concern that this was secondary to an ischemic incident.  His enzymes were negative for MI, and he was scheduled for a Cardiolite.  He had an adenosine Cardiolite on December 21, 1999 and had some flushing and slight chest burning with the medication that resolved after the infusion was discontinued.  His vital signs were stable and he had some nonspecific EKG changes.  He had thinning of the inferoseptal wall with possible mild scarring and EF of 46% but no ischemia.  He was monitored overnight for arrhythmia and had some sinus bradycardia, but was otherwise stable.  His orthostatics have been checked and were negative.  Because he had no significant arrhythmia, no further episodes of syncope or presyncope, and negative orthostatics, he was considered stable for discharge on December 22, 1999.  DISCHARGE CONDITION:  Stable.  CONSULTS:  None.  COMPLICATIONS:  None.  DISCHARGE DIAGNOSES: 1.  Syncope, possibly vasovagal. 2. Coronary artery disease, status post percutaneous transluminal coronary    angioplasty and stent to the left anterior descending artery with    Integrilin.  There was residual disease in the second diagonal that was    subtotaled and 30-40% multiple lesions in the mid circumflex with a 40%    first obtuse marginal.  There was also 20% mid and 40% distal stenosis    in the right coronary artery.  His ejection fraction was 50% at    catheterization and was 40% by Cardiolite. 3. Chronic right bundle-branch block. 4. History of nasal polyp. 5. History of right foot pain. 6. Hypertension. 7. Status post transurethral prostatectomy. 8. History of deviated septum.  DISCHARGE INSTRUCTIONS:  ACTIVITY:  His activity level is to increase gradually.  DIET:  He is to stick to a low fat diet.  FOLLOW-UP:  He is to follow up with Dr. Fatima Blank and it was suggested that he call for an appointment about right foot pain.  He is to follow up with Dr. Daleen Squibb as scheduled and to call the office for any further episodes of presyncope or syncope.  He is to keep his August 23 appointment with Dr. Daleen Squibb.  DISCHARGE MEDICATIONS: 1. Coated aspirin 325 mg q.d. 2. Mavik 2 mg q.d. 3. HCTZ 25 mg q.d. 4. Vitamin E and zinc as taken at home. 5. Nitroglycerin 0.4 mg p.r.n.  6. Plavix 75 mg q.d. until prescription is completed. DD:  12/22/99 TD:  12/22/99 Job: [id number]

## 2011-08-14 DIAGNOSIS — N189 Chronic kidney disease, unspecified: Secondary | ICD-10-CM

## 2011-08-14 HISTORY — DX: Chronic kidney disease, unspecified: N18.9

## 2011-10-22 ENCOUNTER — Encounter (HOSPITAL_COMMUNITY): Payer: Self-pay | Admitting: Pharmacy Technician

## 2011-10-24 ENCOUNTER — Encounter (HOSPITAL_COMMUNITY): Payer: Self-pay

## 2011-10-24 ENCOUNTER — Encounter (HOSPITAL_COMMUNITY)
Admission: RE | Admit: 2011-10-24 | Discharge: 2011-10-24 | Disposition: A | Discharge status: Home / Self Care | Payer: Medicare Other | Source: Ambulatory Visit | Attending: Orthopedic Surgery | Admitting: Orthopedic Surgery

## 2011-10-24 HISTORY — DX: Gastro-esophageal reflux disease without esophagitis: K21.9

## 2011-10-24 HISTORY — DX: Chronic obstructive pulmonary disease, unspecified: J44.9

## 2011-10-24 HISTORY — DX: Shortness of breath: R06.02

## 2011-10-24 HISTORY — DX: Personal history of other diseases of the digestive system: Z87.19

## 2011-10-24 HISTORY — DX: Unspecified osteoarthritis, unspecified site: M19.90

## 2011-10-24 HISTORY — DX: Unspecified asthma, uncomplicated: J45.909

## 2011-10-24 HISTORY — DX: Essential (primary) hypertension: I10

## 2011-10-24 HISTORY — DX: Anemia, unspecified: D64.9

## 2011-10-24 HISTORY — DX: Cardiac arrhythmia, unspecified: I49.9

## 2011-10-24 HISTORY — DX: Headache: R51

## 2011-10-24 HISTORY — DX: Chronic kidney disease, unspecified: N18.9

## 2011-10-24 HISTORY — DX: Sleep apnea, unspecified: G47.30

## 2011-10-24 HISTORY — DX: Pure hypercholesterolemia, unspecified: E78.00

## 2011-10-24 HISTORY — DX: Unspecified glaucoma: H40.9

## 2011-10-24 HISTORY — DX: Peptic ulcer, site unspecified, unspecified as acute or chronic, without hemorrhage or perforation: K27.9

## 2011-10-24 HISTORY — DX: Pneumonia, unspecified organism: J18.9

## 2011-10-24 LAB — URINALYSIS, ROUTINE W REFLEX MICROSCOPIC
Bilirubin Urine: NEGATIVE
Glucose, UA: NEGATIVE mg/dL
Hgb urine dipstick: NEGATIVE
Ketones, ur: NEGATIVE mg/dL
Protein, ur: NEGATIVE mg/dL

## 2011-10-24 LAB — DIFFERENTIAL
Basophils Absolute: 0 10*3/uL (ref 0.0–0.1)
Basophils Relative: 0 % (ref 0–1)
Eosinophils Relative: 4 % (ref 0–5)
Monocytes Absolute: 0.6 10*3/uL (ref 0.1–1.0)

## 2011-10-24 LAB — CBC
HCT: 32.6 % — ABNORMAL LOW (ref 39.0–52.0)
MCHC: 32.5 g/dL (ref 30.0–36.0)
MCV: 100.9 fL — ABNORMAL HIGH (ref 78.0–100.0)
Platelets: 199 10*3/uL (ref 150–400)
RDW: 13.1 % (ref 11.5–15.5)

## 2011-10-24 LAB — BASIC METABOLIC PANEL
BUN: 18 mg/dL (ref 6–23)
Calcium: 9.5 mg/dL (ref 8.4–10.5)
Chloride: 103 mEq/L (ref 96–112)
Creatinine, Ser: 1.64 mg/dL — ABNORMAL HIGH (ref 0.50–1.35)
GFR calc Af Amer: 43 mL/min — ABNORMAL LOW (ref >90)

## 2011-10-24 NOTE — Progress Notes (Signed)
10/24/11 1652  OBSTRUCTIVE SLEEP APNEA  Have you ever been diagnosed with sleep apnea through a sleep study? No  Do you snore loudly (loud enough to be heard through closed doors)?  1  Do you often feel tired, fatigued, or sleepy during the daytime? 1  Has anyone observed you stop breathing during your sleep? 1  Do you have, or are you being treated for high blood pressure? 1  BMI more than 35 kg/m2? 1  Age over 76 years old? 1  Neck circumference greater than 40 cm/18 inches? 0  Gender: 1  Obstructive Sleep Apnea Score 7   Score 4 or greater  Updated health history;Results sent to PCP

## 2011-10-24 NOTE — Pre-Procedure Instructions (Signed)
Notified Courtney at Dr Boneta Lucks office of abnormal CBC with diff, BMET results with request for provider to review. Seen per Dr Leta Jungling at PST visit

## 2011-10-24 NOTE — Patient Instructions (Addendum)
20 Collin Campbell  10/24/2011   Your procedure is scheduled on:  10/30/11  Tuesday  Surgery 1030-1140  Report to [facility] at 0800      AM.  Call this number if you have problems the morning of surgery: [phone]     Or PST   [id number]  [place]              DO NOT TAKE ANY BLOOD SUGAR MEDICINE MORNING OF SURGERY           BRING INHALERS WITH YOU TO HOSPITAL  Remember:   Do not eat food  Or fluids:After Midnight. Monday NIGHT          HAVE SNACK BEFORE BED OR MIDNIGHT Monday NIGHT    Take these medicines the morning of surgery with A SIP OF WATER:CLONODINE, NIPHEDIPINE, PROTONIX,     ALBUTEROL AND SYMBICORT                             MAY TAKE  VICODIN, NITROGLYCERIN or use ALBUTEROL IF NEEDED   Do not wear jewelry, make-up or nail polish.  Do not wear lotions, powders, or perfumes. You may wear deodorant.  Do not shave 48 hours prior to surgery.  Do not bring valuables to the hospital.  Contacts, dentures or bridgework may not be worn into surgery.  Leave suitcase in the car. After surgery it may be brought to your room.  For patients admitted to the hospital, checkout time is 11:00 AM the day of discharge.   Patients discharged the day of surgery will not be allowed to drive home.  Name and phone number of your driver:     Collin Campbell  daughter                                                                 Special Instructions: CHG Shower Use Special Wash: 1/2 bottle night before surgery and 1/2 bottle morning of surgery. REGULAR SOAP FACE AND PRIVATES                        MEN-MAY SHAVE FACE MORNING OF SURGERY  Please read over the following fact sheets that you were given: MRSA Information

## 2011-10-24 NOTE — Anesthesia Preprocedure Evaluation (Addendum)
Anesthesia Evaluation  Patient identified by MRN, date of birth, ID band Patient awake    Reviewed: Allergy & Precautions, H&P , NPO status , Patient's Chart, lab work & pertinent test results, reviewed documented beta blocker date and time   Airway Mallampati: II TM Distance: >3 FB Neck ROM: full    Dental  (+) Missing and Dental Advisory Given,    Pulmonary neg pulmonary ROS, shortness of breath and with exertion, asthma , sleep apnea , pneumonia , COPD COPD inhaler,  Pneumonia 2 years ago breath sounds clear to auscultation  Pulmonary exam normal       Cardiovascular Exercise Tolerance: Good hypertension, Pt. on medications and Pt. on home beta blockers + CAD and + Cardiac Stents negative cardio ROS  + dysrhythmias Atrial Fibrillation Rhythm:regular Rate:Normal  No cardiac symptoms after stent.  Last saw cardiologist 3 years ago.  Hx. Irregular heart beat after surgery but resolved.   Neuro/Psych glaucoma negative neurological ROS  negative psych ROS   GI/Hepatic negative GI ROS, Neg liver ROS, hiatal hernia, PUD, GERD-  Medicated and Controlled,  Endo/Other  negative endocrine ROSDiabetes mellitus-, Well Controlled, Type 2, Insulin Dependent  Renal/GU negative Renal ROS  negative genitourinary   Musculoskeletal   Abdominal   Peds  Hematology negative hematology ROS (+)   Anesthesia Other Findings   Reproductive/Obstetrics negative OB ROS                           Anesthesia Physical Anesthesia Plan  ASA: III  Anesthesia Plan: Spinal   Post-op Pain Management:    Induction:   Airway Management Planned: Simple Face Mask  Additional Equipment:   Intra-op Plan:   Post-operative Plan:   Informed Consent: I have reviewed the patients History and Physical, chart, labs and discussed the procedure including the risks, benefits and alternatives for the proposed anesthesia with the  patient or authorized representative who has indicated his/her understanding and acceptance.   Dental Advisory Given  Plan Discussed with: CRNA and Surgeon  Anesthesia Plan Comments:         Anesthesia Quick Evaluation

## 2011-10-25 ENCOUNTER — Encounter (HOSPITAL_COMMUNITY): Payer: Self-pay

## 2011-10-25 NOTE — Pre-Procedure Instructions (Signed)
LATE ENTRY FOR PST VISIT 10/24/11---per telephone call Dr Charlann Boxer- anesthesia to see today and review as a stress test can not be done until 11/13/11 with cardiology

## 2011-10-26 NOTE — Pre-Procedure Instructions (Signed)
Spoke with pt's wife concerning time change to 12:15 pm Instructed to arrive at short stay at 9:45 am Instructed may have clear liquids 12:00 am to 6:00 am then npo Instructed written down by wife and repeated back .

## 2011-10-27 LAB — MRSA CULTURE

## 2011-10-28 NOTE — H&P (Signed)
Collin Campbell is an 78 y.o. male.    Chief Complaint: right knee OA and pain   HPI: Pt is a 78 y.o. male complaining of right knee pain for 4+ years. She attributes her knee to working on concrete floors all her life. Pain had continually increased since the beginning. X-rays in the clinic show end-stage arthritic changes of bilateral knees. Pt has tried various conservative treatments which have failed to alleviate their symptoms, including NSAIDs and steroid injections. Various options are discussed with the patient. Risks, benefits and expectations were discussed with the patient. Patient understand the risks, benefits and expectations and wishes to proceed with surgery.   PCP:  Bennie Pierini, FNP  D/C Plans:  Home with HHPT/SNF/Rehab  Post-op Meds:  No Rx given  Tranexamic Acid:   Not to be given - Vascular disease  Decadron:   Not to be given - Diabetic  PMH: Past Medical History  Diagnosis Date  . Hypercholesterolemia   . Dysrhythmia     "irregular after surgery"  . Diabetes mellitus   . COPD (chronic obstructive pulmonary disease)   . Shortness of breath     with exertion  . Asthma   . Pneumonia     x 2 years ago  . GERD (gastroesophageal reflux disease)   . H/O hiatal hernia   . Headache     migraines years ago  . Arthritis   . Anemia   . Glaucoma   . Peptic ulcer disease   . Chronic kidney disease 08/14/11    OV  Dr Annabell Howells with recommendations on chart/ BPH  . Sleep apnea     STOP BANG SCORE 7  . Hypertension     EKG 10/18/11, chest 6/13 on chart    PSH: Past Surgical History  Procedure Date  . Transurethral resection of prostate   . Nasal polyp surgery   . Eye surgery     bilateral cataract extraction with IOL  . Coronary angioplasty 12/06/1999    x1 mid LAD/ no current cardiologist    Social History:  reports that he quit smoking about 41 years ago. His smoking use included Cigarettes. He smoked 2 packs per day. He has never used smokeless  tobacco. He reports that he drinks alcohol. He reports that he does not use illicit drugs.  Allergies:  Allergies  Allergen Reactions  . Sulfonamide Derivatives     Medications: No current facility-administered medications for this encounter.   Current Outpatient Prescriptions  Medication Sig Dispense Refill  . Albuterol Sulfate (PROAIR HFA IN) Inhale 1 puff into the lungs every 6 (six) hours as needed. For shortness of breath      . aspirin 325 MG tablet Take 325 mg by mouth at bedtime.      . budesonide-formoterol (SYMBICORT) 80-4.5 MCG/ACT inhaler Inhale 1 puff into the lungs 2 (two) times daily.       . castor oil liquid Take 30 mLs by mouth daily as needed. For constipation      . Cholecalciferol (VITAMIN D3) 2000 UNITS TABS Take 1 tablet by mouth daily.       . cloNIDine (CATAPRES) 0.3 MG tablet Take 0.3 mg by mouth 2 (two) times daily.      . fenofibrate 160 MG tablet Take 160 mg by mouth daily with breakfast.       . ferrous sulfate 325 (65 FE) MG tablet Take 325 mg by mouth 2 (two) times daily.      . finasteride (PROSCAR)  5 MG tablet Take 5 mg by mouth daily with breakfast.       . HYDROcodone-acetaminophen (VICODIN) 5-500 MG per tablet Take 1 tablet by mouth every 6 (six) hours as needed. For pain      . loratadine (CLARITIN) 10 MG tablet Take 10 mg by mouth at bedtime.      Marland Kitchen losartan-hydrochlorothiazide (HYZAAR) 100-25 MG per tablet Take 1 tablet by mouth daily with breakfast.       . meloxicam (MOBIC) 7.5 MG tablet Take 7.5 mg by mouth daily with breakfast.       . metFORMIN (GLUCOPHAGE) 500 MG tablet Take 500 mg by mouth 2 (two) times daily with a meal.       . Multiple Vitamin (MULTIVITAMIN WITH MINERALS) TABS Take 0.5 tablets by mouth 2 (two) times daily.      Marland Kitchen NIFEdipine (PROCARDIA XL/ADALAT-CC) 30 MG 24 hr tablet Take 30 mg by mouth daily with breakfast.      . nitroGLYCERIN (NITROSTAT) 0.6 MG SL tablet Place 0.6 mg under the tongue every 5 (five) minutes as needed.  For chest pain      . pantoprazole (PROTONIX) 40 MG tablet Take 40 mg by mouth daily with breakfast.       . simvastatin (ZOCOR) 40 MG tablet Take 40 mg by mouth at bedtime.       . Tamsulosin HCl (FLOMAX) 0.4 MG CAPS Take 0.4 mg by mouth daily after supper.       . Zinc 50 MG CAPS Take 1 capsule by mouth daily with breakfast.        ROS: Review of Systems  Constitutional: Negative.   HENT: Negative.   Eyes: Negative.   Respiratory: Positive for shortness of breath.   Cardiovascular: Negative.   Gastrointestinal: Positive for constipation.  Genitourinary: Positive for dysuria and urgency.  Musculoskeletal: Positive for myalgias, back pain and joint pain.  Skin: Negative.   Neurological: Negative.   Endo/Heme/Allergies: Negative.   Psychiatric/Behavioral: The patient has insomnia.      Physical Exam: BP: 141/72 ; HR: 80 ; Resp: 18 Physical Exam  Constitutional: He is oriented to person, place, and time and well-developed, well-nourished, and in no distress.  HENT:  Head: Normocephalic and atraumatic.  Nose: Nose normal.  Mouth/Throat: Oropharynx is clear and moist.  Eyes: Pupils are equal, round, and reactive to light.  Neck: Neck supple. No JVD present. No tracheal deviation present. No thyromegaly present.  Cardiovascular: Normal rate, regular rhythm and intact distal pulses.   Pulmonary/Chest: Effort normal. No respiratory distress. He exhibits no tenderness.  Abdominal: Soft. There is no tenderness. There is no guarding.  Musculoskeletal:       Right knee: He exhibits decreased range of motion (5-100), swelling and bony tenderness. He exhibits no effusion, no ecchymosis and no deformity. tenderness found.  Lymphadenopathy:    He has no cervical adenopathy.  Neurological: He is alert and oriented to person, place, and time.  Skin: Skin is warm and dry.  Psychiatric: Affect normal.     Assessment/Plan Assessment: right knee OA and pain   Plan: Patient will undergo  a right total knee arthroplasty on 10/30/2011 per Dr. Charlann Boxer at [facility]. Risks benefits and expectation were discussed with the patient. Patient understand risks, benefits and expectation and wishes to proceed.   Anastasio Auerbach Dereke Neumann   PAC  10/28/2011, 3:32 PM

## 2011-10-30 ENCOUNTER — Inpatient Hospital Stay (HOSPITAL_COMMUNITY)
Admission: RE | Admit: 2011-10-30 | Discharge: 2011-11-01 | DRG: 470 | Disposition: A | Discharge status: Home Health | Payer: Medicare Other | Source: Ambulatory Visit | Attending: Orthopedic Surgery | Admitting: Orthopedic Surgery

## 2011-10-30 ENCOUNTER — Encounter (HOSPITAL_COMMUNITY): Payer: Self-pay | Admitting: *Deleted

## 2011-10-30 ENCOUNTER — Encounter (HOSPITAL_COMMUNITY): Payer: Self-pay | Admitting: Anesthesiology

## 2011-10-30 ENCOUNTER — Encounter (HOSPITAL_COMMUNITY)
Admission: RE | Disposition: A | Discharge status: Home Health | Payer: Self-pay | Source: Ambulatory Visit | Attending: Orthopedic Surgery

## 2011-10-30 ENCOUNTER — Ambulatory Visit (HOSPITAL_COMMUNITY): Payer: Medicare Other | Admitting: Anesthesiology

## 2011-10-30 DIAGNOSIS — K219 Gastro-esophageal reflux disease without esophagitis: Secondary | ICD-10-CM | POA: Diagnosis present

## 2011-10-30 DIAGNOSIS — E119 Type 2 diabetes mellitus without complications: Secondary | ICD-10-CM | POA: Diagnosis present

## 2011-10-30 DIAGNOSIS — Z79899 Other long term (current) drug therapy: Secondary | ICD-10-CM

## 2011-10-30 DIAGNOSIS — M1710 Unilateral primary osteoarthritis, unspecified knee: Principal | ICD-10-CM | POA: Diagnosis present

## 2011-10-30 DIAGNOSIS — E7800 Pure hypercholesterolemia, unspecified: Secondary | ICD-10-CM | POA: Diagnosis present

## 2011-10-30 DIAGNOSIS — Z01812 Encounter for preprocedural laboratory examination: Secondary | ICD-10-CM

## 2011-10-30 DIAGNOSIS — I129 Hypertensive chronic kidney disease with stage 1 through stage 4 chronic kidney disease, or unspecified chronic kidney disease: Secondary | ICD-10-CM | POA: Diagnosis present

## 2011-10-30 DIAGNOSIS — J4489 Other specified chronic obstructive pulmonary disease: Secondary | ICD-10-CM | POA: Diagnosis present

## 2011-10-30 DIAGNOSIS — Z7982 Long term (current) use of aspirin: Secondary | ICD-10-CM

## 2011-10-30 DIAGNOSIS — J449 Chronic obstructive pulmonary disease, unspecified: Secondary | ICD-10-CM | POA: Diagnosis present

## 2011-10-30 DIAGNOSIS — N189 Chronic kidney disease, unspecified: Secondary | ICD-10-CM | POA: Diagnosis present

## 2011-10-30 DIAGNOSIS — Z96659 Presence of unspecified artificial knee joint: Secondary | ICD-10-CM

## 2011-10-30 HISTORY — PX: TOTAL KNEE ARTHROPLASTY: SHX125

## 2011-10-30 LAB — TYPE AND SCREEN
ABO/RH(D): AB POS
Antibody Screen: NEGATIVE

## 2011-10-30 LAB — GLUCOSE, CAPILLARY
Glucose-Capillary: 86 mg/dL (ref 70–99)
Glucose-Capillary: 91 mg/dL (ref 70–99)

## 2011-10-30 SURGERY — ARTHROPLASTY, KNEE, TOTAL
Anesthesia: Spinal | Site: Knee | Laterality: Right | Wound class: Clean

## 2011-10-30 MED ORDER — LACTATED RINGERS IV SOLN: INTRAVENOUS | Status: DC

## 2011-10-30 MED ORDER — PHENOL 1.4 % MT LIQD: 1.0000 | OROMUCOSAL | Status: DC | PRN

## 2011-10-30 MED ORDER — PROPOFOL 10 MG/ML IV BOLUS
INTRAVENOUS | Status: DC | PRN
  Administered 2011-10-30: 30 mg via INTRAVENOUS

## 2011-10-30 MED ORDER — PHENYLEPHRINE HCL 10 MG/ML IJ SOLN
INTRAMUSCULAR | Status: DC | PRN
  Administered 2011-10-30 (×2): 40 ug via INTRAVENOUS

## 2011-10-30 MED ORDER — METHOCARBAMOL 100 MG/ML IJ SOLN
500.0000 mg | Freq: Four times a day (QID) | INTRAVENOUS | Status: DC | PRN
  Administered 2011-10-30: 500 mg via INTRAVENOUS
  Filled 2011-10-30 (×2): qty 5

## 2011-10-30 MED ORDER — MIDAZOLAM HCL 5 MG/5ML IJ SOLN
INTRAMUSCULAR | Status: DC | PRN
  Administered 2011-10-30 (×2): 1 mg via INTRAVENOUS

## 2011-10-30 MED ORDER — TAMSULOSIN HCL 0.4 MG PO CAPS
0.4000 mg | ORAL_CAPSULE | Freq: Every day | ORAL | Status: DC
  Administered 2011-10-31: 0.4 mg via ORAL
  Filled 2011-10-30 (×3): qty 1

## 2011-10-30 MED ORDER — ONDANSETRON HCL 4 MG PO TABS: 4.0000 mg | ORAL_TABLET | Freq: Four times a day (QID) | ORAL | Status: DC | PRN

## 2011-10-30 MED ORDER — SODIUM CHLORIDE 0.9 % IV SOLN
INTRAVENOUS | Status: AC
  Administered 2011-10-30: 23:00:00 via INTRAVENOUS
  Filled 2011-10-30 (×3): qty 1000

## 2011-10-30 MED ORDER — EPHEDRINE SULFATE 50 MG/ML IJ SOLN
INTRAMUSCULAR | Status: DC | PRN
  Administered 2011-10-30: 5 mg via INTRAVENOUS

## 2011-10-30 MED ORDER — ALUM & MAG HYDROXIDE-SIMETH 200-200-20 MG/5ML PO SUSP: 30.0000 mL | ORAL | Status: DC | PRN

## 2011-10-30 MED ORDER — BUPIVACAINE-EPINEPHRINE 0.25% -1:200000 IJ SOLN
INTRAMUSCULAR | Status: AC
  Filled 2011-10-30: qty 1

## 2011-10-30 MED ORDER — BUPIVACAINE IN DEXTROSE 0.75-8.25 % IT SOLN
INTRATHECAL | Status: DC | PRN
  Administered 2011-10-30: 1.8 mL via INTRATHECAL

## 2011-10-30 MED ORDER — SIMVASTATIN 40 MG PO TABS
40.0000 mg | ORAL_TABLET | Freq: Every day | ORAL | Status: DC
  Administered 2011-10-31: 40 mg via ORAL
  Filled 2011-10-30 (×2): qty 1

## 2011-10-30 MED ORDER — DIPHENHYDRAMINE HCL 25 MG PO CAPS: 25.0000 mg | ORAL_CAPSULE | Freq: Four times a day (QID) | ORAL | Status: DC | PRN

## 2011-10-30 MED ORDER — PANTOPRAZOLE SODIUM 40 MG PO TBEC
40.0000 mg | DELAYED_RELEASE_TABLET | Freq: Every day | ORAL | Status: DC
Start: 2011-10-31 — End: 2011-11-01
  Administered 2011-10-31 – 2011-11-01 (×2): 40 mg via ORAL
  Filled 2011-10-30 (×2): qty 1

## 2011-10-30 MED ORDER — ZOLPIDEM TARTRATE 5 MG PO TABS: 5.0000 mg | ORAL_TABLET | Freq: Every evening | ORAL | Status: DC | PRN

## 2011-10-30 MED ORDER — HYDROCHLOROTHIAZIDE 25 MG PO TABS
25.0000 mg | ORAL_TABLET | Freq: Every day | ORAL | Status: DC
  Administered 2011-10-31 – 2011-11-01 (×2): 25 mg via ORAL
  Filled 2011-10-30 (×2): qty 1

## 2011-10-30 MED ORDER — CEFAZOLIN SODIUM 1-5 GM-% IV SOLN
INTRAVENOUS | Status: DC | PRN
  Administered 2011-10-30: 2 g via INTRAVENOUS

## 2011-10-30 MED ORDER — LOSARTAN POTASSIUM-HCTZ 100-25 MG PO TABS: 1.0000 | ORAL_TABLET | Freq: Every day | ORAL | Status: DC

## 2011-10-30 MED ORDER — HYDROMORPHONE HCL PF 1 MG/ML IJ SOLN
0.5000 mg | INTRAMUSCULAR | Status: DC | PRN
  Administered 2011-10-30 – 2011-10-31 (×2): 0.5 mg via INTRAVENOUS
  Filled 2011-10-30 (×2): qty 1

## 2011-10-30 MED ORDER — CLONIDINE HCL 0.3 MG PO TABS
0.3000 mg | ORAL_TABLET | Freq: Two times a day (BID) | ORAL | Status: DC
  Administered 2011-10-30 – 2011-11-01 (×4): 0.3 mg via ORAL
  Filled 2011-10-30 (×5): qty 1

## 2011-10-30 MED ORDER — LORATADINE 10 MG PO TABS
10.0000 mg | ORAL_TABLET | Freq: Every day | ORAL | Status: DC
  Administered 2011-10-30 – 2011-10-31 (×2): 10 mg via ORAL
  Filled 2011-10-30 (×3): qty 1

## 2011-10-30 MED ORDER — RIVAROXABAN 10 MG PO TABS
10.0000 mg | ORAL_TABLET | Freq: Every day | ORAL | Status: DC
  Administered 2011-10-31 – 2011-11-01 (×2): 10 mg via ORAL
  Filled 2011-10-30 (×4): qty 1

## 2011-10-30 MED ORDER — NITROGLYCERIN 0.4 MG SL SUBL: 0.4000 mg | SUBLINGUAL_TABLET | SUBLINGUAL | Status: DC | PRN

## 2011-10-30 MED ORDER — ALBUTEROL SULFATE HFA 108 (90 BASE) MCG/ACT IN AERS
1.0000 | INHALATION_SPRAY | Freq: Four times a day (QID) | RESPIRATORY_TRACT | Status: DC | PRN
  Administered 2011-10-31 – 2011-11-01 (×3): 1 via RESPIRATORY_TRACT
  Filled 2011-10-30: qty 6.7

## 2011-10-30 MED ORDER — PROMETHAZINE HCL 25 MG/ML IJ SOLN: 6.2500 mg | INTRAMUSCULAR | Status: DC | PRN

## 2011-10-30 MED ORDER — POLYETHYLENE GLYCOL 3350 17 G PO PACK
17.0000 g | PACK | Freq: Two times a day (BID) | ORAL | Status: DC
  Administered 2011-10-30 – 2011-11-01 (×4): 17 g via ORAL

## 2011-10-30 MED ORDER — CEFAZOLIN SODIUM-DEXTROSE 2-3 GM-% IV SOLR
2.0000 g | Freq: Four times a day (QID) | INTRAVENOUS | Status: AC
  Administered 2011-10-30 – 2011-10-31 (×2): 2 g via INTRAVENOUS
  Filled 2011-10-30 (×2): qty 50

## 2011-10-30 MED ORDER — BUPIVACAINE-EPINEPHRINE PF 0.25-1:200000 % IJ SOLN
INTRAMUSCULAR | Status: DC | PRN
  Administered 2011-10-30: 50 mL

## 2011-10-30 MED ORDER — ONDANSETRON HCL 4 MG/2ML IJ SOLN
INTRAMUSCULAR | Status: DC | PRN
  Administered 2011-10-30 (×2): 2 mg via INTRAVENOUS

## 2011-10-30 MED ORDER — ONDANSETRON HCL 4 MG/2ML IJ SOLN: 4.0000 mg | Freq: Four times a day (QID) | INTRAMUSCULAR | Status: DC | PRN

## 2011-10-30 MED ORDER — FLEET ENEMA 7-19 GM/118ML RE ENEM: 1.0000 | ENEMA | Freq: Once | RECTAL | Status: AC | PRN

## 2011-10-30 MED ORDER — CEFAZOLIN SODIUM-DEXTROSE 2-3 GM-% IV SOLR
INTRAVENOUS | Status: AC
  Filled 2011-10-30: qty 50

## 2011-10-30 MED ORDER — GLYCOPYRROLATE 0.2 MG/ML IJ SOLN
INTRAMUSCULAR | Status: DC | PRN
  Administered 2011-10-30: 0.2 mg via INTRAVENOUS

## 2011-10-30 MED ORDER — BUDESONIDE-FORMOTEROL FUMARATE 80-4.5 MCG/ACT IN AERO
1.0000 | INHALATION_SPRAY | Freq: Two times a day (BID) | RESPIRATORY_TRACT | Status: DC
  Filled 2011-10-30: qty 6.9

## 2011-10-30 MED ORDER — BISACODYL 5 MG PO TBEC: 5.0000 mg | DELAYED_RELEASE_TABLET | Freq: Every day | ORAL | Status: DC | PRN

## 2011-10-30 MED ORDER — HYDROMORPHONE HCL PF 1 MG/ML IJ SOLN: 0.2500 mg | INTRAMUSCULAR | Status: DC | PRN

## 2011-10-30 MED ORDER — HYDROCODONE-ACETAMINOPHEN 7.5-325 MG PO TABS
1.0000 | ORAL_TABLET | ORAL | Status: DC
  Administered 2011-10-30 – 2011-10-31 (×2): 1 via ORAL
  Administered 2011-10-31: 2 via ORAL
  Administered 2011-10-31 (×2): 1 via ORAL
  Administered 2011-10-31 – 2011-11-01 (×3): 2 via ORAL
  Administered 2011-11-01 (×3): 1 via ORAL
  Filled 2011-10-30 (×2): qty 2
  Filled 2011-10-30: qty 1
  Filled 2011-10-30: qty 2
  Filled 2011-10-30: qty 1
  Filled 2011-10-30 (×3): qty 2
  Filled 2011-10-30 (×3): qty 1

## 2011-10-30 MED ORDER — PROPOFOL 10 MG/ML IV EMUL
INTRAVENOUS | Status: DC | PRN
  Administered 2011-10-30: 75 ug/kg/min via INTRAVENOUS

## 2011-10-30 MED ORDER — MENTHOL 3 MG MT LOZG
1.0000 | LOZENGE | OROMUCOSAL | Status: DC | PRN
  Filled 2011-10-30: qty 9

## 2011-10-30 MED ORDER — KETOROLAC TROMETHAMINE 30 MG/ML IJ SOLN
INTRAMUSCULAR | Status: AC
  Filled 2011-10-30: qty 1

## 2011-10-30 MED ORDER — METHOCARBAMOL 500 MG PO TABS
500.0000 mg | ORAL_TABLET | Freq: Four times a day (QID) | ORAL | Status: DC | PRN
  Administered 2011-10-31 – 2011-11-01 (×4): 500 mg via ORAL
  Filled 2011-10-30 (×4): qty 1

## 2011-10-30 MED ORDER — LOSARTAN POTASSIUM 50 MG PO TABS
100.0000 mg | ORAL_TABLET | Freq: Every day | ORAL | Status: DC
  Administered 2011-10-31 – 2011-11-01 (×2): 100 mg via ORAL
  Filled 2011-10-30 (×2): qty 2

## 2011-10-30 MED ORDER — MEPERIDINE HCL 50 MG/ML IJ SOLN: 6.2500 mg | INTRAMUSCULAR | Status: DC | PRN

## 2011-10-30 MED ORDER — BUDESONIDE-FORMOTEROL FUMARATE 80-4.5 MCG/ACT IN AERO
1.0000 | INHALATION_SPRAY | Freq: Two times a day (BID) | RESPIRATORY_TRACT | Status: DC
  Administered 2011-10-31 – 2011-11-01 (×3): 1 via RESPIRATORY_TRACT
  Filled 2011-10-30: qty 6.9

## 2011-10-30 MED ORDER — FERROUS SULFATE 325 (65 FE) MG PO TABS
325.0000 mg | ORAL_TABLET | Freq: Three times a day (TID) | ORAL | Status: DC
  Administered 2011-10-31 – 2011-11-01 (×5): 325 mg via ORAL
  Filled 2011-10-30 (×7): qty 1

## 2011-10-30 MED ORDER — NITROGLYCERIN 0.6 MG SL SUBL
0.6000 mg | SUBLINGUAL_TABLET | SUBLINGUAL | Status: DC | PRN
  Filled 2011-10-30: qty 100

## 2011-10-30 MED ORDER — FINASTERIDE 5 MG PO TABS
5.0000 mg | ORAL_TABLET | Freq: Every day | ORAL | Status: DC
  Administered 2011-10-31 – 2011-11-01 (×2): 5 mg via ORAL
  Filled 2011-10-30 (×3): qty 1

## 2011-10-30 MED ORDER — SODIUM CHLORIDE 0.9 % IR SOLN
Status: DC | PRN
  Administered 2011-10-30: 3000 mL

## 2011-10-30 MED ORDER — LACTATED RINGERS IV SOLN
INTRAVENOUS | Status: DC
  Administered 2011-10-30: 21:00:00 via INTRAVENOUS

## 2011-10-30 MED ORDER — LACTATED RINGERS IV SOLN
INTRAVENOUS | Status: DC | PRN
  Administered 2011-10-30 (×4): via INTRAVENOUS

## 2011-10-30 MED ORDER — METFORMIN HCL 500 MG PO TABS
500.0000 mg | ORAL_TABLET | Freq: Two times a day (BID) | ORAL | Status: DC
Start: 2011-10-31 — End: 2011-11-01
  Administered 2011-10-31 – 2011-11-01 (×3): 500 mg via ORAL
  Filled 2011-10-30 (×5): qty 1

## 2011-10-30 MED ORDER — METOCLOPRAMIDE HCL 5 MG/ML IJ SOLN: 5.0000 mg | Freq: Three times a day (TID) | INTRAMUSCULAR | Status: DC | PRN

## 2011-10-30 MED ORDER — METOCLOPRAMIDE HCL 10 MG PO TABS: 5.0000 mg | ORAL_TABLET | Freq: Three times a day (TID) | ORAL | Status: DC | PRN

## 2011-10-30 MED ORDER — ACETAMINOPHEN 10 MG/ML IV SOLN
INTRAVENOUS | Status: AC
  Filled 2011-10-30: qty 100

## 2011-10-30 MED ORDER — NIFEDIPINE ER 30 MG PO TB24
30.0000 mg | ORAL_TABLET | Freq: Every day | ORAL | Status: DC
  Administered 2011-10-31 – 2011-11-01 (×2): 30 mg via ORAL
  Filled 2011-10-30 (×2): qty 1

## 2011-10-30 MED ORDER — FENTANYL CITRATE 0.05 MG/ML IJ SOLN
INTRAMUSCULAR | Status: DC | PRN
  Administered 2011-10-30: 50 ug via INTRAVENOUS
  Administered 2011-10-30: 25 ug via INTRAVENOUS
  Administered 2011-10-30 (×3): 50 ug via INTRAVENOUS
  Administered 2011-10-30: 25 ug via INTRAVENOUS

## 2011-10-30 MED ORDER — KETOROLAC TROMETHAMINE 30 MG/ML IJ SOLN
INTRAMUSCULAR | Status: DC | PRN
  Administered 2011-10-30: 30 mg via INTRAMUSCULAR

## 2011-10-30 MED ORDER — CEFAZOLIN SODIUM-DEXTROSE 2-3 GM-% IV SOLR: 2.0000 g | Freq: Once | INTRAVENOUS | Status: DC

## 2011-10-30 MED ORDER — 0.9 % SODIUM CHLORIDE (POUR BTL) OPTIME
TOPICAL | Status: DC | PRN
  Administered 2011-10-30: 1000 mL

## 2011-10-30 MED ORDER — ACETAMINOPHEN 10 MG/ML IV SOLN
INTRAVENOUS | Status: DC | PRN
  Administered 2011-10-30: 1000 mg via INTRAVENOUS

## 2011-10-30 MED ORDER — DOCUSATE SODIUM 100 MG PO CAPS
100.0000 mg | ORAL_CAPSULE | Freq: Two times a day (BID) | ORAL | Status: DC
  Administered 2011-10-30 – 2011-11-01 (×4): 100 mg via ORAL

## 2011-10-30 SURGICAL SUPPLY — 60 items
ADH SKN CLS APL DERMABOND .7 (GAUZE/BANDAGES/DRESSINGS) ×1
BAG SPEC THK2 15X12 ZIP CLS (MISCELLANEOUS) ×1
BAG ZIPLOCK 12X15 (MISCELLANEOUS) ×2 IMPLANT
BANDAGE ELASTIC 6 VELCRO ST LF (GAUZE/BANDAGES/DRESSINGS) ×2 IMPLANT
BANDAGE ESMARK 6X9 LF (GAUZE/BANDAGES/DRESSINGS) ×1 IMPLANT
BLADE SAW SGTL 13.0X1.19X90.0M (BLADE) ×2 IMPLANT
BNDG CMPR 9X6 STRL LF SNTH (GAUZE/BANDAGES/DRESSINGS) ×1
BNDG ESMARK 6X9 LF (GAUZE/BANDAGES/DRESSINGS) ×2
BOWL SMART MIX CTS (DISPOSABLE) ×2 IMPLANT
CEMENT HV SMART SET (Cement) ×2 IMPLANT
CLOTH BEACON ORANGE TIMEOUT ST (SAFETY) ×2 IMPLANT
CUFF TOURN SGL QUICK 34 (TOURNIQUET CUFF) ×2
CUFF TRNQT CYL 34X4X40X1 (TOURNIQUET CUFF) ×1 IMPLANT
DECANTER SPIKE VIAL GLASS SM (MISCELLANEOUS) ×2 IMPLANT
DERMABOND ADVANCED (GAUZE/BANDAGES/DRESSINGS) ×1
DERMABOND ADVANCED .7 DNX12 (GAUZE/BANDAGES/DRESSINGS) ×1 IMPLANT
DRAPE EXTREMITY T 121X128X90 (DRAPE) ×2 IMPLANT
DRAPE POUCH INSTRU U-SHP 10X18 (DRAPES) ×2 IMPLANT
DRAPE U-SHAPE 47X51 STRL (DRAPES) ×2 IMPLANT
DRSG AQUACEL AG ADV 3.5X10 (GAUZE/BANDAGES/DRESSINGS) ×2 IMPLANT
DRSG TEGADERM 4X4.75 (GAUZE/BANDAGES/DRESSINGS) ×2 IMPLANT
DURAPREP 26ML APPLICATOR (WOUND CARE) ×2 IMPLANT
ELECT REM PT RETURN 9FT ADLT (ELECTROSURGICAL) ×2
ELECTRODE REM PT RTRN 9FT ADLT (ELECTROSURGICAL) ×1 IMPLANT
EVACUATOR 1/8 PVC DRAIN (DRAIN) ×2 IMPLANT
FACESHIELD LNG OPTICON STERILE (SAFETY) ×10 IMPLANT
GAUZE SPONGE 2X2 8PLY STRL LF (GAUZE/BANDAGES/DRESSINGS) ×1 IMPLANT
GLOVE BIOGEL PI IND STRL 7.5 (GLOVE) ×1 IMPLANT
GLOVE BIOGEL PI IND STRL 8 (GLOVE) ×1 IMPLANT
GLOVE BIOGEL PI INDICATOR 7.5 (GLOVE) ×1
GLOVE BIOGEL PI INDICATOR 8 (GLOVE) ×1
GLOVE ECLIPSE 8.0 STRL XLNG CF (GLOVE) ×2 IMPLANT
GLOVE ORTHO TXT STRL SZ7.5 (GLOVE) ×4 IMPLANT
GLOVE SURG SS PI 7.5 STRL IVOR (GLOVE) ×2 IMPLANT
GLOVE SURG SS PI 8.0 STRL IVOR (GLOVE) ×2 IMPLANT
GOWN BRE IMP PREV XXLGXLNG (GOWN DISPOSABLE) ×5 IMPLANT
GOWN STRL NON-REIN LRG LVL3 (GOWN DISPOSABLE) ×2 IMPLANT
HANDPIECE INTERPULSE COAX TIP (DISPOSABLE) ×2
IMMOBILIZER KNEE 20 (SOFTGOODS) ×2
IMMOBILIZER KNEE 20 THIGH 36 (SOFTGOODS) IMPLANT
KIT BASIN OR (CUSTOM PROCEDURE TRAY) ×2 IMPLANT
MANIFOLD NEPTUNE II (INSTRUMENTS) ×2 IMPLANT
NDL SAFETY ECLIPSE 18X1.5 (NEEDLE) ×1 IMPLANT
NEEDLE HYPO 18GX1.5 SHARP (NEEDLE) ×2
NS IRRIG 1000ML POUR BTL (IV SOLUTION) ×4 IMPLANT
PACK TOTAL JOINT (CUSTOM PROCEDURE TRAY) ×2 IMPLANT
POSITIONER SURGICAL ARM (MISCELLANEOUS) ×2 IMPLANT
SET HNDPC FAN SPRY TIP SCT (DISPOSABLE) ×1 IMPLANT
SET PAD KNEE POSITIONER (MISCELLANEOUS) ×2 IMPLANT
SPONGE GAUZE 2X2 STER 10/PKG (GAUZE/BANDAGES/DRESSINGS) ×1
SUCTION FRAZIER 12FR DISP (SUCTIONS) ×2 IMPLANT
SUT MNCRL AB 4-0 PS2 18 (SUTURE) ×2 IMPLANT
SUT VIC AB 1 CT1 36 (SUTURE) ×6 IMPLANT
SUT VIC AB 2-0 CT1 27 (SUTURE) ×6
SUT VIC AB 2-0 CT1 TAPERPNT 27 (SUTURE) ×3 IMPLANT
SYR 50ML LL SCALE MARK (SYRINGE) ×2 IMPLANT
TOWEL OR 17X26 10 PK STRL BLUE (TOWEL DISPOSABLE) ×4 IMPLANT
TRAY FOLEY CATH 14FRSI W/METER (CATHETERS) ×2 IMPLANT
WATER STERILE IRR 1500ML POUR (IV SOLUTION) ×2 IMPLANT
WRAP KNEE MAXI GEL POST OP (GAUZE/BANDAGES/DRESSINGS) ×2 IMPLANT

## 2011-10-30 NOTE — Interval H&P Note (Signed)
History and Physical Interval Note:  10/30/2011 3:44 PM  Collin Campbell  has presented today for surgery, with the diagnosis of Osteoarthritis of the Right Knee  The various methods of treatment have been discussed with the patient and family. After consideration of risks, benefits and other options for treatment, the patient has consented to  Procedure(s) (LRB): RIGHT TOTAL KNEE ARTHROPLASTY (Right) as a surgical intervention .  The patient's history has been reviewed, patient examined, no change in status, stable for surgery.  I have reviewed the patients' chart and labs.  Questions were answered to the patient's satisfaction.     Shelda Pal

## 2011-10-30 NOTE — Progress Notes (Signed)
Pt states he had a bowl of oatmeal at 0800. Dr. Payton Doughty paged

## 2011-10-30 NOTE — Op Note (Signed)
NAME:  Collin Campbell                      MEDICAL RECORD NO.:  [id number]                             FACILITY:  [facility]      PHYSICIAN:  Madlyn Frankel. Charlann Boxer, M.D.  DATE OF BIRTH:  [birth year]      DATE OF PROCEDURE:  10/30/2011                                     OPERATIVE REPORT         PREOPERATIVE DIAGNOSIS:  Right knee osteoarthritis.      POSTOPERATIVE DIAGNOSIS:  Right knee osteoarthritis.      FINDINGS:  The patient was noted to have complete loss of cartilage and   bone-on-bone arthritis with associated osteophytes in the medial and patellofemoral compartments of   the knee with a significant synovitis and associated effusion.      PROCEDURE:  Right total knee replacement.      COMPONENTS USED:  DePuy rotating platform posterior stabilized knee   system, a size 5 femur, 4 tibia, 12.5 mm insert, and 38 patellar   button.      SURGEON:  Madlyn Frankel. Charlann Boxer, M.D.      ASSISTANT:  Lanney Gins, PA-C.      ANESTHESIA:  Spinal.      SPECIMENS:  None.      COMPLICATION:  None.      DRAINS:  One Hemovac.  EBL: <200cc      TOURNIQUET TIME:   Total Tourniquet Time Documented: Thigh (Right) - 57 minutes .      The patient was stable to the recovery room.      INDICATION FOR PROCEDURE:  BREVYN RING is a 78 y.o. male patient of   mine.  The patient had been seen, evaluated, and treated conservatively in the   office with medication, activity modification, and injections.  The patient had   radiographic changes of bone-on-bone arthritis with endplate sclerosis and osteophytes noted.      The patient failed conservative measures including medication, injections, and activity modification, and at this point was ready for more definitive measures.   Based on the radiographic changes and failed conservative measures, the patient   decided to proceed with total knee replacement.  Risks of infection,   DVT, component failure, need for revision surgery, postop course, and   expectations were all   discussed and reviewed.  Consent was obtained for benefit of pain   relief.      PROCEDURE IN DETAIL:  The patient was brought to the operative theater.   Once adequate anesthesia, preoperative antibiotics, 2 gm of Ancef administered, the patient was positioned supine with the right thigh tourniquet placed.  The  right lower extremity was prepped and draped in sterile fashion.  A time-   out was performed identifying the patient, planned procedure, and   extremity.      The right lower extremity was placed in the [facility] leg holder.  The leg was   exsanguinated, tourniquet elevated to 250 mmHg.  A midline incision was   made followed by median parapatellar arthrotomy.  Following initial   exposure, attention was first directed to the patella.  Precut  measurement was noted to be 26 mm.  I resected down to 15 mm and used a   38 patellar button to restore patellar height as well as cover the cut   surface.      The lug holes were drilled and a metal shim was placed to protect the   patella from retractors and saw blades.      At this point, attention was now directed to the femur.  The femoral   canal was opened with a drill, irrigated to try to prevent fat emboli.  An   intramedullary rod was passed at 3 degrees valgus, 11 mm of bone was   resected off the distal femur.  Following this resection, the tibia was   subluxated anteriorly.  Using the extramedullary guide, 10 mm of bone was resected off   the proximal lateral tibia initially but after trial reductions 4mm more was removed from the proximal tibia.  I confirmed   the cut was perpendicular in the coronal plane, checking with an alignment rod.      Once this was done, I sized the femur to be a size 5 in the anterior-   posterior dimension, chose a standard component based on medial and   lateral dimension.  The size 5 rotation block was then pinned in   position anterior referenced using the C-clamp to  set rotation.  The   anterior, posterior, and  chamfer cuts were made without difficulty nor   notching making certain that I was along the anterior cortex to help   with flexion gap stability.      The final box cut was made off the lateral aspect of distal femur.      At this point, the tibia was sized to be a size 4, the size 4 tray was   then pinned in position through the medial third of the tubercle,   drilled, and keel punched.  Trial reduction was now carried with a 5 femur,  4 tibia, a 12.5 mm insert, and the 38 patella botton.  The knee was brought to   extension, full extension with good flexion stability with the patella   tracking through the trochlea without application of pressure.  Given   all these findings, the trial components removed.  Final components were   opened and cement was mixed.  The knee was irrigated with normal saline   solution and pulse lavage.  The synovial lining was   then injected with 0.25% Marcaine with epinephrine and 1 cc of Toradol,   total of 61 cc.      The knee was irrigated.  Final implants were then cemented onto clean and   dried cut surfaces of bone with the knee brought to extension with a 12.5   mm trial insert.      Once the cement had fully cured, the excess cement was removed   throughout the knee.  I confirmed I was satisfied with the range of   motion and stability, and the final 12.5 mm PS insert was chosen.  It was   placed into the knee.      The tourniquet had been let down at 56 minutes.  No significant   hemostasis required.  The medium Hemovac drain was placed deep.  The   extensor mechanism was then reapproximated using #1 Vicryl with the knee   in flexion.  The   remaining wound was closed with 2-0 Vicryl and running 4-0 Monocryl.  The knee was cleaned, dried, dressed sterilely using Dermabond and   Aquacel dressing.  Drain site dressed separately.  The patient was then   brought to recovery room in stable  condition, tolerating the procedure   well.   Please note that Physician Assistant, Lanney Gins, was present for the entirety of the case, and was utilized for pre-operative positioning, peri-operative retractor management, general facilitation of the procedure.  He was also utilized for primary wound closure at the end of the case.              Madlyn Frankel Charlann Boxer, M.D.

## 2011-10-30 NOTE — Progress Notes (Signed)
Dr. Acey Lav notified that patient's  Blood sugar is 86. No new orders.

## 2011-10-30 NOTE — Preoperative (Signed)
Beta Blockers   Reason not to administer Beta Blockers:Not Applicable 

## 2011-10-30 NOTE — Transfer of Care (Signed)
Immediate Anesthesia Transfer of Care Note  Patient: Collin Campbell  Procedure(s) Performed: Procedure(s) (LRB): TOTAL KNEE ARTHROPLASTY (Right)  Patient Location: PACU  Anesthesia Type: Regional  Level of Consciousness: awake, alert , oriented and patient cooperative  Airway & Oxygen Therapy: Patient Spontanous Breathing and Patient connected to face mask oxygen  Post-op Assessment: Report given to PACU RN and Post -op Vital signs reviewed and stable  Post vital signs: Reviewed and stable  Complications: No apparent anesthesia complications

## 2011-10-30 NOTE — Anesthesia Postprocedure Evaluation (Signed)
  Anesthesia Post-op Note  Patient: Collin Campbell  Procedure(s) Performed: Procedure(s) (LRB): TOTAL KNEE ARTHROPLASTY (Right)  Patient Location: PACU  Anesthesia Type: Spinal  Level of Consciousness: awake and alert   Airway and Oxygen Therapy: Patient Spontanous Breathing  Post-op Pain: mild  Post-op Assessment: Post-op Vital signs reviewed, Patient's Cardiovascular Status Stable, Respiratory Function Stable, Patent Airway and No signs of Nausea or vomiting  Post-op Vital Signs: stable  Complications: No apparent anesthesia complications

## 2011-10-30 NOTE — Anesthesia Procedure Notes (Signed)
Spinal  Patient location during procedure: OR Staffing Anesthesiologist: Sylvester Minton Performed by: anesthesiologist  Preanesthetic Checklist Completed: patient identified, site marked, surgical consent, pre-op evaluation, timeout performed, IV checked, risks and benefits discussed and monitors and equipment checked Spinal Block Patient position: sitting Prep: Betadine Patient monitoring: heart rate, continuous pulse ox and blood pressure Approach: right paramedian Location: L3-4 Injection technique: single-shot Needle Needle type: Spinocan  Needle gauge: 22 G Needle length: 9 cm Additional Notes Expiration date of kit checked and confirmed. Patient tolerated procedure well, without complications.     

## 2011-10-31 LAB — CBC
MCH: 33.5 pg (ref 26.0–34.0)
MCV: 102 fL — ABNORMAL HIGH (ref 78.0–100.0)
Platelets: 163 10*3/uL (ref 150–400)
RDW: 12.9 % (ref 11.5–15.5)
WBC: 4.9 10*3/uL (ref 4.0–10.5)

## 2011-10-31 LAB — BASIC METABOLIC PANEL
Calcium: 8.8 mg/dL (ref 8.4–10.5)
Creatinine, Ser: 1.33 mg/dL (ref 0.50–1.35)
GFR calc Af Amer: 55 mL/min — ABNORMAL LOW (ref >90)
GFR calc non Af Amer: 48 mL/min — ABNORMAL LOW (ref >90)

## 2011-10-31 LAB — GLUCOSE, CAPILLARY
Glucose-Capillary: 162 mg/dL — ABNORMAL HIGH (ref 70–99)
Glucose-Capillary: 135 mg/dL — ABNORMAL HIGH (ref 70–99)

## 2011-10-31 NOTE — Progress Notes (Signed)
Physical Therapy Treatment Patient Details Name: Collin Campbell MRN: [id number] DOB: 18-Jul-1925 Today's Date: 10/31/2011 Time: 1440-1456 PT Time Calculation (min): 16 min  PT Assessment / Plan / Recommendation Comments on Treatment Session  Pt continues to have increased pain and refused to get out of bed because he didn't want to get sick.  PT encouraged pt that upright positions will improve dizziness/nausea, however pt continued to refuse and reluctantly agreed to perform exercises in bed.     Follow Up Recommendations  Skilled nursing facility    Barriers to Discharge Decreased caregiver support      Equipment Recommendations  Defer to next venue    Recommendations for Other Services OT consult  Frequency 7X/week   Plan Discharge plan remains appropriate    Precautions / Restrictions Precautions Precautions: Knee;Fall Required Braces or Orthoses: Knee Immobilizer - Right Knee Immobilizer - Right: Discontinue once straight leg raise with < 10 degree lag Restrictions Weight Bearing Restrictions: No Other Position/Activity Restrictions: WBAT   Pertinent Vitals/Pain 8/10     Mobility  Bed Mobility Bed Mobility: Supine to Sit;Sitting - Scoot to Edge of Bed Supine to Sit: 4: Min assist;HOB elevated Sitting - Scoot to [place] of Bed: 4: Min assist Details for Bed Mobility Assistance: min assist for RLE off of bed with cues for hand placement to self assist trunk.  Transfers Transfers: Sit to Stand;Stand to Sit Sit to Stand: 1: +2 Total assist;From elevated surface;With upper extremity assist;From bed Sit to Stand: Patient Percentage: 60% Stand to Sit: 1: +2 Total assist;With upper extremity assist;With armrests;To chair/3-in-1 Stand to Sit: Patient Percentage: 60% Details for Transfer Assistance: Performed x 2 reps in order to use 3in1.  +2 for safety and line management with cues for UE placement and LE management when sitting/standing.  Ambulation/Gait Ambulation/Gait  Assistance: 1: +2 Total assist Ambulation/Gait: Patient Percentage: 60% Ambulation Distance (Feet): 5 Feet Assistive device: Rolling walker Ambulation/Gait Assistance Details: Only took some steps from 3in1 to chair due to nausea/lightheadedness.  BP was 110/76, which pt states is low for him.  Cues for sequencing/technqiue with RW and safety.  Gait Pattern: Step-to pattern;Trunk flexed;Decreased stance time - right;Decreased step length - left Gait velocity: decreased Stairs: No Wheelchair Mobility Wheelchair Mobility: No    Exercises Total Joint Exercises Ankle Circles/Pumps: AROM;Both;20 reps Quad Sets: AROM;Right;10 reps Heel Slides: AAROM;Right;10 reps Hip ABduction/ADduction: AAROM;Right;10 reps Straight Leg Raises: AAROM;Right;10 reps   PT Diagnosis: Difficulty walking;Abnormality of gait;Generalized weakness;Acute pain  PT Problem List: Decreased strength;Decreased range of motion;Decreased activity tolerance;Decreased mobility;Decreased balance;Decreased safety awareness;Decreased knowledge of use of DME;Pain PT Treatment Interventions: DME instruction;Gait training;Functional mobility training;Therapeutic activities;Therapeutic exercise;Balance training;Patient/family education   PT Goals Acute Rehab PT Goals PT Goal Formulation: With patient Time For Goal Achievement: 11/07/11 Potential to Achieve Goals: Fair Pt will go Supine/Side to Sit: with supervision PT Goal: Supine/Side to Sit - Progress: Goal set today Pt will go Sit to Supine/Side: with supervision PT Goal: Sit to Supine/Side - Progress: Goal set today Pt will go Sit to Stand: with supervision PT Goal: Sit to Stand - Progress: Goal set today Pt will go Stand to Sit: with supervision PT Goal: Stand to Sit - Progress: Goal set today Pt will Ambulate: 51 - 150 feet;with least restrictive assistive device;with supervision PT Goal: Ambulate - Progress: Goal set today Additional Goals Additional Goal #1: Performed  exercises in order to assist in attaining goals  Visit Information  Last PT Received On: 10/31/11 Assistance Needed: +2  Subjective Data  Subjective: I'm not getting up again b/c I'll get sick Patient Stated Goal: to return home   Cognition  Overall Cognitive Status: Appears within functional limits for tasks assessed/performed Arousal/Alertness: Awake/alert Orientation Level: Appears intact for tasks assessed Behavior During Session: [facility] for tasks performed    Balance     End of Session PT - End of Session Equipment Utilized During Treatment: Gait belt;Right knee immobilizer Activity Tolerance: Patient limited by pain Patient left: in bed;with call bell/phone within reach Nurse Communication: Patient requests pain meds    Page, Meribeth Mattes 10/31/2011, 3:12 PM

## 2011-10-31 NOTE — Progress Notes (Signed)
Utilization review completed. [employer] has on-site reviewer as well

## 2011-10-31 NOTE — Evaluation (Signed)
Physical Therapy Evaluation Patient Details Name: Collin Campbell MRN: [id number] DOB: 09/08/1925 Today's Date: 10/31/2011 Time: 7829-5621 PT Time Calculation (min): 30 min  PT Assessment / Plan / Recommendation Clinical Impression  Pt presents s/p R TKA POD 1 with decreased strength, ROM and mobility.  tolerated some steps from bed to 3in1 to recliner, however unable to continue with ambulation due to nausea.  Assisted pt to chair and BP was 110/76, which pt states is somewhat low for him.  Pt is requiring +2 assist at this time for safety.  Pt will benefit from skilled PT in acute venue to address deficits.  PT recommends SNF for follow up therapy at D/C in order to increase pt safety and decrease burden of care for wife.     PT Assessment  Patient needs continued PT services    Follow Up Recommendations  Skilled nursing facility    Barriers to Discharge Decreased caregiver support      lEquipment Recommendations  Defer to next venue    Recommendations for Other Services OT consult   Frequency 7X/week    Precautions / Restrictions Precautions Precautions: Knee;Fall Required Braces or Orthoses: Knee Immobilizer - Right Knee Immobilizer - Right: Discontinue once straight leg raise with < 10 degree lag Restrictions Weight Bearing Restrictions: No Other Position/Activity Restrictions: WBAT   Pertinent Vitals/Pain 10/10, "My pain couldn't get any worse"      Mobility  Bed Mobility Bed Mobility: Supine to Sit;Sitting - Scoot to Edge of Bed Supine to Sit: 4: Min assist;HOB elevated Sitting - Scoot to [place] of Bed: 4: Min assist Details for Bed Mobility Assistance: min assist for RLE off of bed with cues for hand placement to self assist trunk.  Transfers Transfers: Sit to Stand;Stand to Sit Sit to Stand: 1: +2 Total assist;From elevated surface;With upper extremity assist;From bed Sit to Stand: Patient Percentage: 60% Stand to Sit: 1: +2 Total assist;With upper extremity  assist;With armrests;To chair/3-in-1 Stand to Sit: Patient Percentage: 60% Details for Transfer Assistance: Performed x 2 reps in order to use 3in1.  +2 for safety and line management with cues for UE placement and LE management when sitting/standing.  Ambulation/Gait Ambulation/Gait Assistance: 1: +2 Total assist Ambulation/Gait: Patient Percentage: 60% Ambulation Distance (Feet): 5 Feet Assistive device: Rolling walker Ambulation/Gait Assistance Details: Only took some steps from 3in1 to chair due to nausea/lightheadedness.  BP was 110/76, which pt states is low for him.  Cues for sequencing/technqiue with RW and safety.  Gait Pattern: Step-to pattern;Trunk flexed;Decreased stance time - right;Decreased step length - left Gait velocity: decreased Stairs: No Wheelchair Mobility Wheelchair Mobility: No    Exercises     PT Diagnosis: Difficulty walking;Abnormality of gait;Generalized weakness;Acute pain  PT Problem List: Decreased strength;Decreased range of motion;Decreased activity tolerance;Decreased mobility;Decreased balance;Decreased safety awareness;Decreased knowledge of use of DME;Pain PT Treatment Interventions: DME instruction;Gait training;Functional mobility training;Therapeutic activities;Therapeutic exercise;Balance training;Patient/family education   PT Goals Acute Rehab PT Goals PT Goal Formulation: With patient Time For Goal Achievement: 11/07/11 Potential to Achieve Goals: Fair Pt will go Supine/Side to Sit: with supervision PT Goal: Supine/Side to Sit - Progress: Goal set today Pt will go Sit to Supine/Side: with supervision PT Goal: Sit to Supine/Side - Progress: Goal set today Pt will go Sit to Stand: with supervision PT Goal: Sit to Stand - Progress: Goal set today Pt will go Stand to Sit: with supervision PT Goal: Stand to Sit - Progress: Goal set today Pt will Ambulate: 51 - 150 feet;with least  restrictive assistive device;with supervision PT Goal: Ambulate  - Progress: Goal set today  Visit Information  Last PT Received On: 10/31/11 Assistance Needed: +2    Subjective Data  Subjective: I don't know how I'm doing Patient Stated Goal: to return home   Prior Functioning  Home Living Lives With: Spouse Available Help at Discharge: Skilled Nursing Facility Type of Home: House Home Access: Stairs to enter Home Layout: One level Additional Comments: Pt states that his wife has recently had a knee replacement and that it was recalled and will have to have it revised.  Prior Function Level of Independence: Independent Driving: No Vocation: Retired Musician: No difficulties    Cognition  Overall Cognitive Status: Appears within functional limits for tasks assessed/performed Arousal/Alertness: Awake/alert Orientation Level: Appears intact for tasks assessed Behavior During Session: [facility] for tasks performed    Extremity/Trunk Assessment Right Lower Extremity Assessment RLE ROM/Strength/Tone: Deficits RLE ROM/Strength/Tone Deficits: able to initiate SLR, however cannot lift it independently without assist.  Ankle motions WFL.  RLE Coordination: WFL - gross motor Left Lower Extremity Assessment LLE ROM/Strength/Tone: WFL for tasks assessed LLE Coordination: WFL - gross motor Trunk Assessment Trunk Assessment: Kyphotic   Balance    End of Session PT - End of Session Equipment Utilized During Treatment: Gait belt;Right knee immobilizer Activity Tolerance: Other (comment) (Limited by nausea) Patient left: in chair;with call bell/phone within reach Nurse Communication: Mobility status   Page, Meribeth Mattes 10/31/2011, 12:15 PM

## 2011-10-31 NOTE — Progress Notes (Signed)
Clinical Social Work Department BRIEF PSYCHOSOCIAL ASSESSMENT 10/31/2011  Patient:  Collin Campbell, Collin Campbell     Account Number:  [id number]     Admit date:  10/30/2011  Clinical Social Worker:  Candie Chroman  Date/Time:  10/31/2011 04:17 PM  Referred by:    Date Referred:  10/31/2011 Referred for  SNF Placement   Other Referral:   Interview type:  Patient Other interview type:    PSYCHOSOCIAL DATA Living Status:  WIFE Admitted from facility:   Level of care:   Primary support name:  Payton Mccallum Primary support relationship to patient:  SPOUSE Degree of support available:   limited due to health concerns    CURRENT CONCERNS Current Concerns  Post-Acute Placement   Other Concerns:    SOCIAL WORK ASSESSMENT / PLAN Pt is an 78 yr old gentleman living at home prior to hospitalization. Met with pt to assist with d/c planning. PT has recommended ST SNF placement upon hospital d/c . Pt is declining SNF placement at this time stating he will return home . CSW will continue to be available to assist with d/c planning if pt changes his mind and will accept SNF placement.   Assessment/plan status:  No Further Intervention Required Other assessment/ plan:   Home with HH   Information/referral to community resources:   SNF list    PATIENT'S/FAMILY'S RESPONSE TO PLAN OF CARE: Pt plans to return home with [facility] services.      Cori Razor LCSW [phone]

## 2011-10-31 NOTE — Progress Notes (Signed)
  Subjective: 1 Day Post-Op Procedure(s) (LRB): TOTAL KNEE ARTHROPLASTY (Right)   Patient reports pain as mild, pain well controlled. No events throughout the night.   Objective:   VITALS:   Filed Vitals:   10/31/11 1010  BP: 154/75  Pulse: 87  Temp: 97.5 F (36.4 C)  Resp: 14    Neurovascular intact Dorsiflexion/Plantar flexion intact Incision: dressing C/D/I No cellulitis present Compartment soft  LABS  Basename 10/31/11 0400  HGB 8.3*  HCT 25.3*  WBC 4.9  PLT 163     Basename 10/31/11 0400  NA 139  K 4.6  BUN 13  CREATININE 1.33  GLUCOSE 104*     Assessment/Plan: 1 Day Post-Op Procedure(s) (LRB): TOTAL KNEE ARTHROPLASTY (Right)   HV drain d/c'ed Foley cath d/c'ed Advance diet Up with therapy D/C IV fluids Discharge home with home health tomorrow if continues to do well.   Anastasio Auerbach Eilee Schader   PAC  10/31/2011, 10:47 AM

## 2011-11-01 ENCOUNTER — Encounter (HOSPITAL_COMMUNITY): Payer: Self-pay | Admitting: Orthopedic Surgery

## 2011-11-01 LAB — BASIC METABOLIC PANEL
Calcium: 9.1 mg/dL (ref 8.4–10.5)
Chloride: 101 mEq/L (ref 96–112)
Creatinine, Ser: 1.37 mg/dL — ABNORMAL HIGH (ref 0.50–1.35)
GFR calc Af Amer: 53 mL/min — ABNORMAL LOW (ref >90)

## 2011-11-01 LAB — CBC
MCH: 33.3 pg (ref 26.0–34.0)
MCV: 101.2 fL — ABNORMAL HIGH (ref 78.0–100.0)
Platelets: 189 10*3/uL (ref 150–400)
RDW: 13.1 % (ref 11.5–15.5)
WBC: 8 10*3/uL (ref 4.0–10.5)

## 2011-11-01 MED ORDER — METHOCARBAMOL 500 MG PO TABS: 500.0000 mg | ORAL_TABLET | Freq: Four times a day (QID) | ORAL | Status: AC | PRN

## 2011-11-01 MED ORDER — POLYETHYLENE GLYCOL 3350 17 G PO PACK: 17.0000 g | PACK | Freq: Two times a day (BID) | ORAL | Status: AC

## 2011-11-01 MED ORDER — FERROUS SULFATE 325 (65 FE) MG PO TABS: 325.0000 mg | ORAL_TABLET | Freq: Three times a day (TID) | ORAL | Status: DC

## 2011-11-01 MED ORDER — HYDROCODONE-ACETAMINOPHEN 7.5-325 MG PO TABS: 1.0000 | ORAL_TABLET | ORAL | Status: AC | PRN

## 2011-11-01 MED ORDER — DIPHENHYDRAMINE HCL 25 MG PO CAPS: 25.0000 mg | ORAL_CAPSULE | Freq: Four times a day (QID) | ORAL | Status: DC | PRN

## 2011-11-01 MED ORDER — DSS 100 MG PO CAPS: 100.0000 mg | ORAL_CAPSULE | Freq: Two times a day (BID) | ORAL | Status: AC

## 2011-11-01 MED ORDER — ASPIRIN EC 325 MG PO TBEC: 325.0000 mg | DELAYED_RELEASE_TABLET | Freq: Two times a day (BID) | ORAL | Status: AC

## 2011-11-01 NOTE — Evaluation (Signed)
Occupational Therapy Evaluation Patient Details Name: Collin Campbell MRN: [id number] DOB: [birth year] Today's Date: 11/01/2011 Time: 1016-1040 OT Time Calculation (min): 24 min  OT Assessment / Plan / Recommendation Clinical Impression  Pt is a 78 yo male s/p R TKA and displays increased pain, decreased strength and functional mobility and will benefit from skilled OT services to improve independence with ADL for next venue of care.     OT Assessment  Patient needs continued OT Services    Follow Up Recommendations  Skilled nursing facility    Barriers to Discharge      Equipment Recommendations  Defer to next venue    Recommendations for Other Services    Frequency  Min 1X/week    Precautions / Restrictions Precautions Precautions: Knee;Fall Required Braces or Orthoses: Knee Immobilizer - Right Knee Immobilizer - Right: Discontinue once straight leg raise with < 10 degree lag Restrictions Weight Bearing Restrictions: No        ADL  Eating/Feeding: Simulated;Independent Where Assessed - Eating/Feeding: Chair Grooming: Simulated;Wash/dry hands;Set up Where Assessed - Grooming: Unsupported sitting Upper Body Bathing: Simulated;Chest;Right arm;Left arm;Abdomen;Set up;Supervision/safety Where Assessed - Upper Body Bathing: Unsupported sitting Lower Body Bathing: Simulated;+2 Total assistance Lower Body Bathing: Patient Percentage: 50% Where Assessed - Lower Body Bathing: Supported sit to stand Upper Body Dressing: Simulated;Set up;Supervision/safety Where Assessed - Upper Body Dressing: Unsupported sitting Lower Body Dressing: Simulated;+2 Total assistance;Other (comment) (+2 for safety) Lower Body Dressing: Patient Percentage: 40% Where Assessed - Lower Body Dressing: Sopported sit to stand Toilet Transfer: Simulated;+2 Total assistance Toilet Transfer: Patient Percentage: 60% Toilet Transfer Method: Stand pivot Toileting - Clothing Manipulation and Hygiene:  Simulated;+2 Total assistance Toileting - Architect and Hygiene: Patient Percentage: 40% Where Assessed - Glass blower/designer Manipulation and Hygiene: Standing Tub/Shower Transfer Method: Not assessed Equipment Used: Rolling walker ADL Comments: Pt limited by pain. Had difficulty coming up to standing, requiring +2 assist to fully rise and stabilize.    OT Diagnosis: Generalized weakness  OT Problem List: Decreased strength;Decreased activity tolerance;Decreased knowledge of use of DME or AE;Pain OT Treatment Interventions: Self-care/ADL training;Therapeutic activities;DME and/or AE instruction;Patient/family education   OT Goals Acute Rehab OT Goals OT Goal Formulation: With patient Time For Goal Achievement: 11/08/11 Potential to Achieve Goals: Good ADL Goals Pt Will Perform Grooming: with min assist;Standing at sink ADL Goal: Grooming - Progress: Goal set today Pt Will Perform Lower Body Bathing: with mod assist;Sit to stand from chair;Sit to stand from bed ADL Goal: Lower Body Bathing - Progress: Goal set today Pt Will Perform Lower Body Dressing: with mod assist;Sit to stand from chair;Sit to stand from bed ADL Goal: Lower Body Dressing - Progress: Goal set today Pt Will Transfer to Toilet: with mod assist;Ambulation;with DME;3-in-1 ADL Goal: Toilet Transfer - Progress: Goal set today Pt Will Perform Toileting - Clothing Manipulation: with mod assist;Standing ADL Goal: Toileting - Clothing Manipulation - Progress: Goal set today  Visit Information  Last OT Received On: 11/01/11 Assistance Needed: +2    Subjective Data  Subjective: I need to do for myself Patient Stated Goal: as above. be independent as possible   Prior Functioning       Cognition  Overall Cognitive Status: Appears within functional limits for tasks assessed/performed Arousal/Alertness: Awake/alert Orientation Level: Appears intact for tasks assessed Behavior During Session: [facility] for tasks  performed    Extremity/Trunk Assessment Right Upper Extremity Assessment RUE ROM/Strength/Tone: [facility] for tasks assessed Left Upper Extremity Assessment LUE ROM/Strength/Tone: [facility] for tasks assessed  Mobility Bed Mobility Bed Mobility: Supine to Sit Supine to Sit: HOB elevated;4: Min assist Sitting - Scoot to Edge of Bed: 4: Min assist Details for Bed Mobility Assistance: min assist to help support R LE off bed and cues for hand placement and overall technique Transfers Transfers: Sit to Stand;Stand to Sit Sit to Stand: 1: +2 Total assist;From bed;From elevated surface;With upper extremity assist Sit to Stand: Patient Percentage: 60% Stand to Sit: 1: +2 Total assist;To chair/3-in-1 Stand to Sit: Patient Percentage: 60% Details for Transfer Assistance: attempted to stand with +1 assist but needed 2nd person to help fully transition to standing and help stabilize RW. Min verbal cues for LE placement and hand placement. Pt trying to pull up on RW   Exercise    Balance    End of Session OT - End of Session Equipment Utilized During Treatment: Gait belt Activity Tolerance: Patient limited by pain Patient left: in chair;with call bell/phone within reach   Lennox Laity [phone] 11/01/2011, 12:18 PM

## 2011-11-01 NOTE — Progress Notes (Signed)
Physical Therapy Treatment Patient Details Name: Collin Campbell MRN: [id number] DOB: 1926/01/02 Today's Date: 11/01/2011 Time: 8657-8469 PT Time Calculation (min): 25 min  PT Assessment / Plan / Recommendation Comments on Treatment Session  Pt tolerated 2nd session however c/o fatigue, a headache and 10/10 knee pain.  Pt plans to D/C to home with spouse.    Follow Up Recommendations  Other (comment) (pt declining SNF palcement)    Barriers to Discharge        Equipment Recommendations  Defer to next venue    Recommendations for Other Services    Frequency 7X/week   Plan Discharge plan remains appropriate    Precautions / Restrictions Precautions Precautions: Knee Required Braces or Orthoses: Knee Immobilizer - Right Knee Immobilizer - Right: Discontinue once straight leg raise with < 10 degree lag Restrictions Weight Bearing Restrictions: No Other Position/Activity Restrictions: WBAT    Pertinent Vitals/Pain 10/10 R knee pain [employer] [facility] Nurse notified    Mobility  Bed Mobility Bed Mobility: Supine to Sit;Sit to Supine Supine to Sit: 4: Min assist Sitting - Scoot to Edge of Bed: 4: Min assist Details for Bed Mobility Assistance: increased time and min assist to support R LE on/off bed Transfers Transfers: Sit to Stand;Stand to Sit Sit to Stand: 4: Min assist;From bed Sit to Stand: Patient Percentage: 60% Stand to Sit: 4: Min assist;To bed Stand to Sit: Patient Percentage: 60% Details for Transfer Assistance: increased time and one VC on hand placement  Ambulation/Gait Ambulation/Gait Assistance: 4: Min assist Ambulation Distance (Feet): 45 Feet Assistive device: Rolling walker Ambulation/Gait Assistance Details: 50% VC's to increase posture and proper walker to self distance Gait Pattern: Step-to pattern;Decreased stance time - right Gait velocity: decreased General Gait Details: chair following behind for safety Stairs: No (pt has a ramp at  home) Wheelchair Mobility Wheelchair Mobility: No        PT Goals   progressing    Visit Information  Last PT Received On: 11/01/11 Assistance Needed: +1    Subjective Data      Cognition  Overall Cognitive Status: Appears within functional limits for tasks assessed/performed Arousal/Alertness: Awake/alert Orientation Level: Appears intact for tasks assessed Behavior During Session: [facility] for tasks performed    Balance   good with RW  End of Session PT - End of Session Equipment Utilized During Treatment: Gait belt;Right knee immobilizer Activity Tolerance: Patient tolerated treatment well Patient left: in bed;Other (comment) Nurse Communication: Other (comment) (requests pain meds)   [name]  PTA [facility]  Acute  Rehab Pager     [phone]

## 2011-11-01 NOTE — Progress Notes (Signed)
Patient ID: Collin Campbell, male   DOB: 13-Jul-1925, 78 y.o.   MRN: [id number] Subjective: 2 Days Post-Op Procedure(s) (LRB): TOTAL KNEE ARTHROPLASTY (Right)    Patient reports pain as moderate.  Seems ok no events  Objective:   VITALS:   Filed Vitals:   11/01/11 0445  BP: 124/74  Pulse: 79  Temp: 99.5 F (37.5 C)  Resp: 16    Neurovascular intact Incision: dressing C/D/I  LABS  Basename 11/01/11 0432 10/31/11 0400  HGB 8.5* 8.3*  HCT 25.8* 25.3*  WBC 8.0 4.9  PLT 189 163     Basename 11/01/11 0432 10/31/11 0400  NA 135 139  K 4.2 4.6  BUN 15 13  CREATININE 1.37* 1.33  GLUCOSE 121* 104*    No results found for this basename: LABPT:2,INR:2 in the last 72 hours   Assessment/Plan: 2 Days Post-Op Procedure(s) (LRB): TOTAL KNEE ARTHROPLASTY (Right)   Up with therapy Discharge home with home health pending therapy evaluation and recommendations

## 2011-11-01 NOTE — Progress Notes (Signed)
Physical Therapy Treatment Patient Details Name: Collin Campbell MRN: [id number] DOB: 1926-02-14 Today's Date: 11/01/2011 Time: 1120-1200 PT Time Calculation (min): 40 min  PT Assessment / Plan / Recommendation Comments on Treatment Session  Pt progressing slowly and plans to D/C to home.    Follow Up Recommendations  Other (comment) (pt declining SNF)    Barriers to Discharge        Equipment Recommendations  Defer to next venue    Recommendations for Other Services    Frequency 7X/week   Plan Discharge plan remains appropriate    Precautions / Restrictions Precautions Precautions: Knee Required Braces or Orthoses: Knee Immobilizer - Right Knee Immobilizer - Right: Discontinue once straight leg raise with < 10 degree lag Restrictions Weight Bearing Restrictions: No Other Position/Activity Restrictions: WBAT    Pertinent Vitals/Pain C/o 6/10 during TE's Applied ICE    Mobility  Bed Mobility Bed Mobility: Not assessed  Pt OOB in recliner Transfers Transfers: Sit to Stand;Stand to Sit Sit to Stand: 3: Mod assist;From chair/3-in-1 Sit to Stand: Patient Percentage: 60% Stand to Sit: 3: Mod assist;To chair/3-in-1 Stand to Sit: Patient Percentage: 60% Details for Transfer Assistance: increased time and one VC on hand placement Ambulation/Gait Ambulation/Gait Assistance: 4: Min assist Ambulation Distance (Feet): 18 Feet Assistive device: Rolling walker Ambulation/Gait Assistance Details: 75% VC's on proper sequencing and proper advancement of RW as pt tended to step before moving RW causing posterior LOB. Gait Pattern: Step-to pattern;Decreased stance time - right;Decreased stride length Gait velocity: decreased General Gait Details: chair following behind for safety   Exercises Total Joint Exercises Ankle Circles/Pumps: AROM;Both;10 reps;Supine Quad Sets: AROM;Both;10 reps;Supine Gluteal Sets: AROM;Both;10 reps;Supine Towel Squeeze: AROM;Both;10  reps;Supine Short Arc Quad: 10 reps;Supine;AAROM;Right Heel Slides: AAROM;Right;10 reps;Supine Hip ABduction/ADduction: AAROM;Right;10 reps;Supine Straight Leg Raises: AAROM;Right;10 reps;Supine     PT Goals            Progressing slowly    Visit Information  Last PT Received On: 11/01/11 Assistance Needed: +1    Subjective Data      Cognition  Overall Cognitive Status: Appears within functional limits for tasks assessed/performed Arousal/Alertness: Awake/alert Orientation Level: Appears intact for tasks assessed Behavior During Session: [facility] for tasks performed    Balance   poor  End of Session PT - End of Session Equipment Utilized During Treatment: Gait belt;Right knee immobilizer Activity Tolerance: Patient tolerated treatment well Patient left: in chair;with call bell/phone within reach;Other (comment) (ICE to R knee)    [name]  PTA [facility]  Acute  Rehab Pager     [phone]

## 2011-11-01 NOTE — Care Management Note (Signed)
    Page 1 of 2   11/01/2011     1:59:00 PM   CARE MANAGEMENT NOTE 11/01/2011  Patient:  Collin Campbell, Collin Campbell   Account Number:  [id number]  Date Initiated:  11/01/2011  Documentation initiated by:  Colleen Can  Subjective/Objective Assessment:   dx osteoarthritis knee-right; total knee replacemnt     Action/Plan:   CM spoke with patient. Plans are for patient to return to his home in [place] where spouse and daughter will be caregivers,. Pt states he has dme-RW, 3N1. Has acess to ramp to his home. Pt wants HH agency that MD office referred him to(l   Anticipated DC Date:  11/01/2011   Anticipated DC Plan:  HOME W HOME HEALTH SERVICES  In-house referral  Clinical Social Worker      DC Associate Professor  CM consult      [facility] Choice  HOME HEALTH   Choice offered to / List presented to:  C-1 Patient   DME arranged  NA      DME agency  NA     HH arranged  HH-2 PT      [facility] agency  [facility]   Status of service:  Completed, signed off Medicare Important Message given?  NA - LOS <3 / Initial given by admissions (If response is "NO", the following Medicare IM given date fields will be blank) Date Medicare IM given:   Date Additional Medicare IM given:    Discharge Disposition:  HOME W HOME HEALTH SERVICES  Per UR Regulation:    If discussed at Long Length of Stay Meetings, dates discussed:    Comments:  11/01/2011 [name] BSN CCM [phone] [facility] Care contacted and [facility] ordrs, face sheet, op note, h&P faxed /receipt confirmed. Per Marzella Schlein for [place] states [facility] services will start Friday 11/02/2011

## 2011-11-01 NOTE — Progress Notes (Signed)
Patient laying in bed with daughter at bedside. Discharge instructions and education given with verbalization of understanding.  He will be discharging home today and daughter will transport.

## 2011-11-02 NOTE — Discharge Summary (Signed)
Physician Discharge Summary  Patient ID: Collin Campbell MRN: [id number] DOB/AGE: 20-Aug-1925 78 y.o.  Admit date: 10/30/2011 Discharge date: 11/01/2011  Procedures:  Procedure(s) (LRB): TOTAL KNEE ARTHROPLASTY (Right)  Attending Physician:  Dr. Durene Romans   Admission Diagnoses:  Right knee OA and pain   Discharge Diagnoses:  Principal Problem:  *S/P right TKA Hypercholesterolemia   Dysrhythmia   Diabetes mellitus   COPD   Shortness of breath with exertion   Asthma   Hx of Pneumonia x 2 years ago   GERD   H/O hiatal hernia    Arthritis   Anemia   Glaucoma   Peptic ulcer disease   Chronic kidney disease   Sleep apnea   Hypertension   HPI: Pt is a 78 y.o. male complaining of right knee pain for 4+ years. She attributes her knee to working on concrete floors all her life. Pain had continually increased since the beginning. X-rays in the clinic show end-stage arthritic changes of bilateral knees. Pt has tried various conservative treatments which have failed to alleviate their symptoms, including NSAIDs and steroid injections. Various options are discussed with the patient. Risks, benefits and expectations were discussed with the patient. Patient understand the risks, benefits and expectations and wishes to proceed with surgery.    PCP: Bennie Pierini, FNP   Discharged Condition: good  Hospital Course:  Patient underwent the above stated procedure on 10/30/2011. Patient tolerated the procedure well and brought to the recovery room in good condition and subsequently to the floor.  POD #1 BP: 154/75 ; Pulse: 87 ; Temp: 97.5 F (36.4 C) ; Resp: 14  Pt's foley was removed, as well as the hemovac drain removed. IV was changed to a saline lock. Patient reports pain as mild, pain well controlled. No events throughout the night.  Neurovascular intact, dorsiflexion/plantar flexion intact, incision: dressing C/D/I, no cellulitis present and compartment soft.   LABS    Basename  10/31/11 0400   HGB  8.3  HCT  25.3    POD #2  BP: 124/74 ; Pulse: 79 ; Temp: 99.5 F (37.5 C) ; Resp: 16  Patient reports pain as moderate. Seems ok no events Neurovascular intact, dorsiflexion/plantar flexion intact, incision: dressing C/D/I, no cellulitis present and compartment soft.   LABS  Basename  11/01/11 0432   HGB  8.5  HCT  25.8    Discharge Exam: General appearance: alert, cooperative and no distress Extremities: Homans sign is negative, no sign of DVT, no edema, redness or tenderness in the calves or thighs and no ulcers, gangrene or trophic changes  Disposition: Home  with follow up in 2 weeks   Follow-up Information    Follow up with Shelda Pal, MD. Schedule an appointment as soon as possible for a visit in 2 weeks.   Contact information:   [facility] [address], [address] [place] [address] [phone]          Discharge Orders    Future Orders Please Complete By Expires   Diet - low sodium heart healthy      Call MD / Call 911      Comments:   If you experience chest pain or shortness of breath, CALL 911 and be transported to the hospital emergency room.  If you develope a fever above 101 F, pus (white drainage) or increased drainage or redness at the wound, or calf pain, call your surgeon's office.   Discharge instructions      Comments:  Maintain surgical dressing for 8 days, then replace with gauze and tape. Keep the area dry and clean until follow up. Follow up in 2 weeks at [facility]. Call with any questions or concerns.   Constipation Prevention      Comments:   Drink plenty of fluids.  Prune juice may be helpful.  You may use a stool softener, such as Colace (over the counter) 100 mg twice a day.  Use MiraLax (over the counter) for constipation as needed.   Increase activity slowly as tolerated      Driving restrictions      Comments:   No driving for 4 weeks   TED hose       Comments:   Use stockings (TED hose) for 2 weeks on both leg(s).  You may remove them at night for sleeping.   Change dressing      Comments:   Maintain surgical dressing for 8 days, then change the dressing daily with sterile 4 x 4 inch gauze dressing and tape. Keep the area dry and clean.      Discharge Medication List as of 11/01/2011 10:50 AM    START taking these medications   Details  aspirin EC 325 MG tablet Take 1 tablet (325 mg total) by mouth 2 (two) times daily. X 4 weeks, Starting 11/01/2011, Until Sun 11/11/11, No Print    diphenhydrAMINE (BENADRYL) 25 mg capsule Take 1 capsule (25 mg total) by mouth every 6 (six) hours as needed for itching, allergies or sleep., Starting 11/01/2011, Until Sun 11/11/11, No Print    docusate sodium 100 MG CAPS Take 100 mg by mouth 2 (two) times daily., Starting 11/01/2011, Until Sun 11/11/11, No Print    HYDROcodone-acetaminophen (NORCO) 7.5-325 MG per tablet Take 1-2 tablets by mouth every 4 (four) hours as needed for pain., Starting 11/01/2011, Until Sun 11/11/11, Print    methocarbamol (ROBAXIN) 500 MG tablet Take 1 tablet (500 mg total) by mouth every 6 (six) hours as needed (muscle spasms)., Starting 11/01/2011, Until Sun 11/11/11, Print    polyethylene glycol (MIRALAX / GLYCOLAX) packet Take 17 g by mouth 2 (two) times daily., Starting 11/01/2011, Until Sun 11/04/11, No Print      CONTINUE these medications which have CHANGED   Details  ferrous sulfate 325 (65 FE) MG tablet Take 1 tablet (325 mg total) by mouth 3 (three) times daily after meals., Starting 11/01/2011, Until Fri 10/31/12, No Print      CONTINUE these medications which have NOT CHANGED   Details  Albuterol Sulfate (PROAIR HFA IN) Inhale 1 puff into the lungs every 6 (six) hours as needed. For shortness of breath, Until Discontinued, Historical Med    budesonide-formoterol (SYMBICORT) 80-4.5 MCG/ACT inhaler Inhale 1 puff into the lungs 2 (two) times daily. , Until Discontinued,  Historical Med    castor oil liquid Take 30 mLs by mouth daily as needed. For constipation, Until Discontinued, Historical Med    Cholecalciferol (VITAMIN D3) 2000 UNITS TABS Take 1 tablet by mouth daily. , Until Discontinued, Historical Med    fenofibrate 160 MG tablet Take 160 mg by mouth daily with breakfast. , Until Discontinued, Historical Med    finasteride (PROSCAR) 5 MG tablet Take 5 mg by mouth daily with breakfast. , Until Discontinued, Historical Med    loratadine (CLARITIN) 10 MG tablet Take 10 mg by mouth at bedtime., Until Discontinued, Historical Med    losartan-hydrochlorothiazide (HYZAAR) 100-25 MG per tablet Take 1 tablet by mouth daily with breakfast. ,  Until Discontinued, Historical Med    metFORMIN (GLUCOPHAGE) 500 MG tablet Take 500 mg by mouth 2 (two) times daily with a meal. , Until Discontinued, Historical Med    Multiple Vitamin (MULTIVITAMIN WITH MINERALS) TABS Take 0.5 tablets by mouth 2 (two) times daily., Until Discontinued, Historical Med    NIFEdipine (PROCARDIA XL/ADALAT-CC) 30 MG 24 hr tablet Take 30 mg by mouth daily with breakfast., Until Discontinued, Historical Med    pantoprazole (PROTONIX) 40 MG tablet Take 40 mg by mouth daily with breakfast. , Until Discontinued, Historical Med    simvastatin (ZOCOR) 40 MG tablet Take 40 mg by mouth at bedtime. , Until Discontinued, Historical Med    Tamsulosin HCl (FLOMAX) 0.4 MG CAPS Take 0.4 mg by mouth daily after supper. , Until Discontinued, Historical Med    Zinc 50 MG CAPS Take 1 capsule by mouth daily with breakfast., Until Discontinued, Historical Med    cloNIDine (CATAPRES) 0.3 MG tablet Take 0.3 mg by mouth 2 (two) times daily., Until Discontinued, Historical Med    nitroGLYCERIN (NITROSTAT) 0.6 MG SL tablet Place 0.6 mg under the tongue every 5 (five) minutes as needed. For chest pain, Until Discontinued, Historical Med      STOP taking these medications     aspirin 325 MG tablet Comments:    Reason for Stopping:       HYDROcodone-acetaminophen (VICODIN) 5-500 MG per tablet Comments:  Reason for Stopping:       meloxicam (MOBIC) 7.5 MG tablet Comments:  Reason for Stopping:           Signed: Anastasio Auerbach. Dessiree Sze   PAC  11/02/2011, 9:06 AM

## 2011-11-13 ENCOUNTER — Encounter: Payer: Medicare Other | Admitting: Cardiovascular Disease

## 2012-02-11 NOTE — Progress Notes (Signed)
When you can, we need orders in EPIC - surg is 02/19/12  Thank you

## 2012-02-12 ENCOUNTER — Encounter (HOSPITAL_COMMUNITY): Payer: Self-pay | Admitting: Pharmacy Technician

## 2012-02-12 NOTE — Progress Notes (Signed)
Need orders pre op is 10-4

## 2012-02-15 ENCOUNTER — Encounter (HOSPITAL_COMMUNITY)
Admission: RE | Admit: 2012-02-15 | Discharge: 2012-02-15 | Disposition: A | Discharge status: Home / Self Care | Payer: Medicare Other | Source: Ambulatory Visit | Attending: Orthopedic Surgery | Admitting: Orthopedic Surgery

## 2012-02-15 ENCOUNTER — Encounter (HOSPITAL_COMMUNITY): Payer: Self-pay

## 2012-02-15 DIAGNOSIS — G4730 Sleep apnea, unspecified: Secondary | ICD-10-CM

## 2012-02-15 HISTORY — DX: Sleep apnea, unspecified: G47.30

## 2012-02-15 LAB — URINALYSIS, ROUTINE W REFLEX MICROSCOPIC
Ketones, ur: NEGATIVE mg/dL
Leukocytes, UA: NEGATIVE
Nitrite: NEGATIVE
Specific Gravity, Urine: 1.013 (ref 1.005–1.030)
Urobilinogen, UA: 0.2 mg/dL (ref 0.0–1.0)
pH: 6.5 (ref 5.0–8.0)

## 2012-02-15 LAB — BASIC METABOLIC PANEL
Chloride: 102 mEq/L (ref 96–112)
GFR calc Af Amer: 86 mL/min — ABNORMAL LOW (ref >90)
Potassium: 4.1 mEq/L (ref 3.5–5.1)
Sodium: 139 mEq/L (ref 135–145)

## 2012-02-15 LAB — CBC
HCT: 32 % — ABNORMAL LOW (ref 39.0–52.0)
Hemoglobin: 10.5 g/dL — ABNORMAL LOW (ref 13.0–17.0)
WBC: 2.9 10*3/uL — ABNORMAL LOW (ref 4.0–10.5)

## 2012-02-15 LAB — APTT: aPTT: 37 seconds (ref 24–37)

## 2012-02-15 LAB — PROTIME-INR: INR: 1.13 (ref 0.00–1.49)

## 2012-02-15 LAB — SURGICAL PCR SCREEN: Staphylococcus aureus: NEGATIVE

## 2012-02-15 NOTE — Progress Notes (Signed)
02/15/12 1334  OBSTRUCTIVE SLEEP APNEA  Have you ever been diagnosed with sleep apnea through a sleep study? No  Do you snore loudly (loud enough to be heard through closed doors)?  0  Do you often feel tired, fatigued, or sleepy during the daytime? 0  Has anyone observed you stop breathing during your sleep? 1  Do you have, or are you being treated for high blood pressure? 1  BMI more than 35 kg/m2? 0  Age over 76 years old? 1  Neck circumference greater than 40 cm/18 inches? 0  Gender: 1  Obstructive Sleep Apnea Score 4

## 2012-02-15 NOTE — Pre-Procedure Instructions (Addendum)
02-15-12 EKG/CXR reports 6'13 with chart. 02-15-12 Note to Dr. Nilsa Nutting office- labs viewable in Epic.W. Larina Lieurance,RN 02-15-12 1600 Teresa Eller,daughter-made aware of patients surgery time changed to 1345, and to arrive by 1100 AM, stop Clear Liquids by 0700 AM. W. Jobe Mutch,RN

## 2012-02-15 NOTE — Progress Notes (Signed)
02-15-12 labs viewable in Epic.

## 2012-02-15 NOTE — Patient Instructions (Addendum)
Collin Campbell  02/15/2012   Your procedure is scheduled on:  10-8 -2013  Report to [facility] at     1330 PM.  Call this number if you have problems the morning of surgery: [phone]  Or Presurgical Testing [phone]([place])   Remember:   Do not eat food:After Midnight.  May have clear liquids:up to 6 Hours before arrival. Nothing after : 1000 AM.  Clear liquids include soda, tea, black coffee, apple or grape juice, broth.  Take these medicines the morning of surgery with A SIP OF WATER: Symbicort inhaler and bring. Clonidine. Fenofibrate. Stool softener. Finasteride. Hydrocodone. Nifedipine. Pantprazole. Aspirin as per Dr. Nilsa Nutting instructions.   Do not wear jewelry, make-up or nail polish.  Do not wear lotions, powders, or perfumes. You may wear deodorant.  Do not shave 48 hours prior to surgery.(face and neck okay, no shaving of legs)  Do not bring valuables to the hospital.  Contacts, dentures or bridgework may not be worn into surgery.  Leave suitcase in the car. After surgery it may be brought to your room.  For patients admitted to the hospital, checkout time is 11:00 AM the day of discharge.   Patients discharged the day of surgery will not be allowed to drive home. Must have responsible person with you x 24 hours once discharged.  Name and phone number of your driver: Rosey Bath Eller,daughter [phone]  Special Instructions: CHG Shower Use Special Wash: see special instructions.(avoid face and genitals)   Please read over the following fact sheets that you were given: MRSA Information, Blood Transfusion fact sheet, Incentive Spirometry Instruction.

## 2012-02-15 NOTE — H&P (Signed)
TOTAL KNEE ADMISSION H&P  Patient is being admitted for left total knee arthroplasty.  Subjective:  Chief Complaint:left knee pain.  HPI: Collin Campbell, 78 y.o. male, has a history of pain and functional disability in the left knee due to arthritis and has failed non-surgical conservative treatments for greater than 12 weeks to includeNSAID's and/or analgesics and corticosteriod injections.  Onset of symptoms was gradual, starting 4 years ago with gradually worsening course since that time. The patient noted no past surgery on the left knee(s).  Patient currently rates pain in the left knee(s) at 8 out of 10 with activity. Patient has pain that interferes with activities of daily living and crepitus.  Patient has evidence of periarticular osteophytes and joint space narrowing by imaging studies. Risks, benefits and expectations were discussed with the patient. Patient understand the risks, benefits and expectations and wishes to proceed with surgery.    D/C Plans:  Home with HHPT  Post-op Meds:  No Rx given   Tranexamic Acid:  Not to be given - PVD  Decadron:  Not to be given - DM    Patient Active Problem List   Diagnosis Date Noted  . S/P right TKA 10/30/2011  . Hypertension 09/04/2010  . Hyperlipidemia 09/04/2010  . RECTAL BLEEDING 09/22/2009  . ALLERGIC ARTHRITIS OTHER SPECIFIED SITES 09/22/2009  . ARTHRITIS 09/22/2009  . DM 08/31/2009  . OTHER SPECIFIED ANEMIAS 08/31/2009  . CHRONIC ANGLE-CLOSURE GLAUCOMA 08/31/2009  . CAD 08/31/2009  . SLEEP APNEA 08/31/2009   Past Medical History  Diagnosis Date  . Hypercholesterolemia   . Dysrhythmia     "irregular after surgery"  . Diabetes mellitus   . COPD (chronic obstructive pulmonary disease)   . Shortness of breath     with exertion  . Asthma   . Pneumonia     x 2 years ago  . GERD (gastroesophageal reflux disease)   . H/O hiatal hernia   . Headache     migraines years ago  . Arthritis   . Anemia   . Glaucoma     . Peptic ulcer disease   . Chronic kidney disease 08/14/11    OV  Dr Annabell Howells with recommendations on chart/ BPH  . Hypertension     EKG 10/18/11, chest 6/13 on chart  . Sleep apnea 02-15-12    STOP BANG SCORE 4    Past Surgical History  Procedure Date  . Transurethral resection of prostate   . Nasal polyp surgery   . Eye surgery     bilateral cataract extraction with IOL  . Coronary angioplasty 12/06/1999    x1 mid LAD/ no current cardiologist  . Total knee arthroplasty 10/30/2011    Procedure: TOTAL KNEE ARTHROPLASTY;  Surgeon: Shelda Pal, MD;  Location: [facility] ORS;  Service: Orthopedics;  Laterality: Right;     Allergies  Allergen Reactions  . Sulfonamide Derivatives Swelling and Rash    Feet feels like like on needles     History  Substance Use Topics  . Smoking status: Former Smoker -- 2.0 packs/day    Types: Cigarettes    Quit date: 10/24/1970  . Smokeless tobacco: Never Used  . Alcohol Use: Yes     2-3 shots vodka nightly      Review of Systems  Constitutional: Negative.   HENT: Negative.   Eyes: Negative.   Respiratory: Positive for shortness of breath.   Cardiovascular: Negative.   Genitourinary: Positive for urgency and frequency.  Musculoskeletal: Positive for myalgias and joint  pain.  Skin: Negative.   Neurological: Negative.   Endo/Heme/Allergies: Negative.   Psychiatric/Behavioral: Negative.     Objective:  Physical Exam  Constitutional: He is oriented to person, place, and time. He appears well-developed and well-nourished.  HENT:  Head: Normocephalic and atraumatic.  Nose: Nose normal.  Mouth/Throat: Oropharynx is clear and moist.  Eyes: Pupils are equal, round, and reactive to light.  Neck: Neck supple. No JVD present. No tracheal deviation present. No thyromegaly present.  Cardiovascular: Normal rate, regular rhythm and intact distal pulses.   Respiratory: Effort normal and breath sounds normal. No stridor. No respiratory distress. He has no  wheezes.  GI: Soft. There is no tenderness. There is no guarding.  Musculoskeletal:       Left knee: He exhibits decreased range of motion, swelling and bony tenderness. He exhibits no effusion, no ecchymosis, no laceration and no erythema. tenderness found.  Lymphadenopathy:    He has no cervical adenopathy.  Neurological: He is alert and oriented to person, place, and time.  Skin: Skin is warm and dry.  Psychiatric: He has a normal mood and affect.    Vital signs in last 24 hours: Temp:  [97.5 F (36.4 C)] 97.5 F (36.4 C) (10/04 1546) Pulse Rate:  [54] 54  (10/04 1546) Resp:  [18] 18  (10/04 1546) BP: (137)/(65) 137/65 mmHg (10/04 1546) SpO2:  [99 %] 99 % (10/04 1546) Weight:  [82.555 kg (182 lb)] 82.555 kg (182 lb) (10/04 1546)  Labs:   Estimated Body mass index is 31.96 kg/(m^2) as calculated from the following:   Height as of 10/24/11: 5\' 6" (1.676 m).   Weight as of 10/24/11: 198 lb(89.812 kg).   Imaging Review Plain radiographs demonstrate severe degenerative joint disease of the left knee(s). The overall alignment is neutral. The bone quality appears to be good for age and reported activity level.  Assessment/Plan:  End stage arthritis, left knee   The patient history, physical examination, clinical judgment of the provider and imaging studies are consistent with end stage degenerative joint disease of the left knee(s) and total knee arthroplasty is deemed medically necessary. The treatment options including medical management, injection therapy arthroscopy and arthroplasty were discussed at length. The risks and benefits of total knee arthroplasty were presented and reviewed. The risks due to aseptic loosening, infection, stiffness, patella tracking problems, thromboembolic complications and other imponderables were discussed. The patient acknowledged the explanation, agreed to proceed with the plan and consent was signed. Patient is being admitted for inpatient treatment  for surgery, pain control, PT, OT, prophylactic antibiotics, VTE prophylaxis, progressive ambulation and ADL's and discharge planning. The patient is planning to be discharged home with home health services.   Collin Campbell   PAC  02/15/2012, 6:56 PM

## 2012-02-19 ENCOUNTER — Encounter (HOSPITAL_COMMUNITY): Payer: Self-pay | Admitting: Anesthesiology

## 2012-02-19 ENCOUNTER — Encounter (HOSPITAL_COMMUNITY)
Admission: RE | Disposition: A | Discharge status: Home Health | Payer: Self-pay | Source: Ambulatory Visit | Attending: Orthopedic Surgery

## 2012-02-19 ENCOUNTER — Encounter (HOSPITAL_COMMUNITY): Payer: Self-pay | Admitting: *Deleted

## 2012-02-19 ENCOUNTER — Inpatient Hospital Stay (HOSPITAL_COMMUNITY)
Admission: RE | Admit: 2012-02-19 | Discharge: 2012-02-21 | DRG: 470 | Disposition: A | Discharge status: Home Health | Payer: Medicare Other | Source: Ambulatory Visit | Attending: Orthopedic Surgery | Admitting: Orthopedic Surgery

## 2012-02-19 ENCOUNTER — Inpatient Hospital Stay (HOSPITAL_COMMUNITY): Payer: Medicare Other | Admitting: Anesthesiology

## 2012-02-19 DIAGNOSIS — N189 Chronic kidney disease, unspecified: Secondary | ICD-10-CM | POA: Diagnosis present

## 2012-02-19 DIAGNOSIS — IMO0002 Reserved for concepts with insufficient information to code with codable children: Principal | ICD-10-CM | POA: Diagnosis present

## 2012-02-19 DIAGNOSIS — Z96659 Presence of unspecified artificial knee joint: Secondary | ICD-10-CM

## 2012-02-19 DIAGNOSIS — D500 Iron deficiency anemia secondary to blood loss (chronic): Secondary | ICD-10-CM

## 2012-02-19 DIAGNOSIS — I2510 Atherosclerotic heart disease of native coronary artery without angina pectoris: Secondary | ICD-10-CM | POA: Diagnosis present

## 2012-02-19 DIAGNOSIS — M1710 Unilateral primary osteoarthritis, unspecified knee: Principal | ICD-10-CM | POA: Diagnosis present

## 2012-02-19 DIAGNOSIS — J449 Chronic obstructive pulmonary disease, unspecified: Secondary | ICD-10-CM | POA: Diagnosis present

## 2012-02-19 DIAGNOSIS — D62 Acute posthemorrhagic anemia: Secondary | ICD-10-CM | POA: Diagnosis not present

## 2012-02-19 DIAGNOSIS — J4489 Other specified chronic obstructive pulmonary disease: Secondary | ICD-10-CM | POA: Diagnosis present

## 2012-02-19 DIAGNOSIS — I129 Hypertensive chronic kidney disease with stage 1 through stage 4 chronic kidney disease, or unspecified chronic kidney disease: Secondary | ICD-10-CM | POA: Diagnosis present

## 2012-02-19 DIAGNOSIS — Z87891 Personal history of nicotine dependence: Secondary | ICD-10-CM

## 2012-02-19 HISTORY — PX: TOTAL KNEE ARTHROPLASTY: SHX125

## 2012-02-19 LAB — GLUCOSE, CAPILLARY: Glucose-Capillary: 75 mg/dL (ref 70–99)

## 2012-02-19 LAB — TYPE AND SCREEN: Antibody Screen: NEGATIVE

## 2012-02-19 SURGERY — ARTHROPLASTY, KNEE, TOTAL
Anesthesia: Spinal | Site: Knee | Laterality: Left | Wound class: Clean

## 2012-02-19 MED ORDER — SODIUM CHLORIDE 0.9 % IV SOLN
INTRAVENOUS | Status: DC
  Administered 2012-02-19 – 2012-02-20 (×3): via INTRAVENOUS
  Filled 2012-02-19 (×7): qty 1000

## 2012-02-19 MED ORDER — GLYCOPYRROLATE 0.2 MG/ML IJ SOLN
INTRAMUSCULAR | Status: DC | PRN
  Administered 2012-02-19: 0.2 mg via INTRAVENOUS

## 2012-02-19 MED ORDER — ACETAMINOPHEN 325 MG PO TABS: 650.0000 mg | ORAL_TABLET | Freq: Four times a day (QID) | ORAL | Status: DC | PRN

## 2012-02-19 MED ORDER — CEFAZOLIN SODIUM-DEXTROSE 2-3 GM-% IV SOLR
2.0000 g | INTRAVENOUS | Status: AC
  Administered 2012-02-19: 2 g via INTRAVENOUS

## 2012-02-19 MED ORDER — ONDANSETRON HCL 4 MG PO TABS: 4.0000 mg | ORAL_TABLET | Freq: Four times a day (QID) | ORAL | Status: DC | PRN

## 2012-02-19 MED ORDER — BUPIVACAINE IN DEXTROSE 0.75-8.25 % IT SOLN
INTRATHECAL | Status: DC | PRN
  Administered 2012-02-19: 1.6 mL via INTRATHECAL

## 2012-02-19 MED ORDER — PROMETHAZINE HCL 25 MG/ML IJ SOLN: 6.2500 mg | INTRAMUSCULAR | Status: DC | PRN

## 2012-02-19 MED ORDER — LOSARTAN POTASSIUM-HCTZ 100-25 MG PO TABS: 1.0000 | ORAL_TABLET | Freq: Every day | ORAL | Status: DC

## 2012-02-19 MED ORDER — LOSARTAN POTASSIUM 50 MG PO TABS
100.0000 mg | ORAL_TABLET | Freq: Every day | ORAL | Status: DC
  Administered 2012-02-20 – 2012-02-21 (×2): 100 mg via ORAL
  Filled 2012-02-19 (×2): qty 2

## 2012-02-19 MED ORDER — 0.9 % SODIUM CHLORIDE (POUR BTL) OPTIME
TOPICAL | Status: DC | PRN
  Administered 2012-02-19: 1000 mL

## 2012-02-19 MED ORDER — HYDROCODONE-ACETAMINOPHEN 7.5-325 MG PO TABS
1.0000 | ORAL_TABLET | ORAL | Status: DC | PRN
  Administered 2012-02-19 – 2012-02-20 (×2): 2 via ORAL
  Administered 2012-02-20 – 2012-02-21 (×4): 1 via ORAL
  Filled 2012-02-19: qty 2
  Filled 2012-02-19 (×4): qty 1
  Filled 2012-02-19 (×2): qty 2

## 2012-02-19 MED ORDER — FENTANYL CITRATE 0.05 MG/ML IJ SOLN
INTRAMUSCULAR | Status: DC | PRN
  Administered 2012-02-19 (×2): 50 ug via INTRAVENOUS

## 2012-02-19 MED ORDER — HYDROCHLOROTHIAZIDE 25 MG PO TABS
25.0000 mg | ORAL_TABLET | Freq: Every day | ORAL | Status: DC
  Administered 2012-02-20 – 2012-02-21 (×2): 25 mg via ORAL
  Filled 2012-02-19 (×2): qty 1

## 2012-02-19 MED ORDER — ZINC 50 MG PO CAPS: 1.0000 | ORAL_CAPSULE | Freq: Every day | ORAL | Status: DC

## 2012-02-19 MED ORDER — METOCLOPRAMIDE HCL 5 MG/ML IJ SOLN: 5.0000 mg | Freq: Three times a day (TID) | INTRAMUSCULAR | Status: DC | PRN

## 2012-02-19 MED ORDER — SIMVASTATIN 40 MG PO TABS
40.0000 mg | ORAL_TABLET | Freq: Every day | ORAL | Status: DC
  Administered 2012-02-19 – 2012-02-20 (×2): 40 mg via ORAL
  Filled 2012-02-19 (×3): qty 1

## 2012-02-19 MED ORDER — RIVAROXABAN 10 MG PO TABS
10.0000 mg | ORAL_TABLET | Freq: Every day | ORAL | Status: DC
  Administered 2012-02-20 – 2012-02-21 (×2): 10 mg via ORAL
  Filled 2012-02-19 (×4): qty 1

## 2012-02-19 MED ORDER — ZINC SULFATE 220 (50 ZN) MG PO CAPS
220.0000 mg | ORAL_CAPSULE | Freq: Every day | ORAL | Status: DC
  Administered 2012-02-20 – 2012-02-21 (×2): 220 mg via ORAL
  Filled 2012-02-19 (×2): qty 1

## 2012-02-19 MED ORDER — METHOCARBAMOL 500 MG PO TABS
500.0000 mg | ORAL_TABLET | Freq: Four times a day (QID) | ORAL | Status: DC
  Administered 2012-02-19 – 2012-02-21 (×8): 500 mg via ORAL
  Filled 2012-02-19 (×11): qty 1

## 2012-02-19 MED ORDER — FERROUS SULFATE 325 (65 FE) MG PO TABS
325.0000 mg | ORAL_TABLET | Freq: Three times a day (TID) | ORAL | Status: DC
  Administered 2012-02-19 – 2012-02-21 (×6): 325 mg via ORAL
  Filled 2012-02-19 (×8): qty 1

## 2012-02-19 MED ORDER — NIFEDIPINE ER 30 MG PO TB24
30.0000 mg | ORAL_TABLET | Freq: Every day | ORAL | Status: DC
  Administered 2012-02-20 – 2012-02-21 (×2): 30 mg via ORAL
  Filled 2012-02-19 (×4): qty 1

## 2012-02-19 MED ORDER — FENOFIBRATE 160 MG PO TABS
160.0000 mg | ORAL_TABLET | Freq: Every day | ORAL | Status: DC
  Administered 2012-02-20 – 2012-02-21 (×2): 160 mg via ORAL
  Filled 2012-02-19 (×2): qty 1

## 2012-02-19 MED ORDER — BUDESONIDE-FORMOTEROL FUMARATE 80-4.5 MCG/ACT IN AERO
1.0000 | INHALATION_SPRAY | Freq: Two times a day (BID) | RESPIRATORY_TRACT | Status: DC
  Administered 2012-02-19 – 2012-02-21 (×4): 1 via RESPIRATORY_TRACT
  Filled 2012-02-19: qty 6.9

## 2012-02-19 MED ORDER — CLONIDINE HCL 0.3 MG PO TABS
0.3000 mg | ORAL_TABLET | Freq: Two times a day (BID) | ORAL | Status: DC
  Administered 2012-02-19 – 2012-02-21 (×4): 0.3 mg via ORAL
  Filled 2012-02-19 (×5): qty 1

## 2012-02-19 MED ORDER — METFORMIN HCL 500 MG PO TABS
500.0000 mg | ORAL_TABLET | Freq: Two times a day (BID) | ORAL | Status: DC
  Administered 2012-02-20 – 2012-02-21 (×3): 500 mg via ORAL
  Filled 2012-02-19 (×6): qty 1

## 2012-02-19 MED ORDER — INSULIN ASPART 100 UNIT/ML ~~LOC~~ SOLN
0.0000 [IU] | Freq: Three times a day (TID) | SUBCUTANEOUS | Status: DC
  Administered 2012-02-20 – 2012-02-21 (×3): 1 [IU] via SUBCUTANEOUS

## 2012-02-19 MED ORDER — TAMSULOSIN HCL 0.4 MG PO CAPS
0.4000 mg | ORAL_CAPSULE | Freq: Every day | ORAL | Status: DC
  Administered 2012-02-19 – 2012-02-20 (×2): 0.4 mg via ORAL
  Filled 2012-02-19 (×3): qty 1

## 2012-02-19 MED ORDER — LORATADINE 10 MG PO TABS
10.0000 mg | ORAL_TABLET | Freq: Every day | ORAL | Status: DC
  Administered 2012-02-19 – 2012-02-20 (×2): 10 mg via ORAL
  Filled 2012-02-19 (×3): qty 1

## 2012-02-19 MED ORDER — FINASTERIDE 5 MG PO TABS
5.0000 mg | ORAL_TABLET | Freq: Every day | ORAL | Status: DC
  Administered 2012-02-20 – 2012-02-21 (×2): 5 mg via ORAL
  Filled 2012-02-19 (×2): qty 1

## 2012-02-19 MED ORDER — FERROUS SULFATE 325 (65 FE) MG PO TABS: 325.0000 mg | ORAL_TABLET | Freq: Three times a day (TID) | ORAL | Status: DC

## 2012-02-19 MED ORDER — HYDROMORPHONE HCL PF 1 MG/ML IJ SOLN: 0.2500 mg | INTRAMUSCULAR | Status: DC | PRN

## 2012-02-19 MED ORDER — SODIUM CHLORIDE 0.9 % IR SOLN
Status: DC | PRN
  Administered 2012-02-19: 1000 mL

## 2012-02-19 MED ORDER — ACETAMINOPHEN 650 MG RE SUPP: 650.0000 mg | Freq: Four times a day (QID) | RECTAL | Status: DC | PRN

## 2012-02-19 MED ORDER — ONDANSETRON HCL 4 MG/2ML IJ SOLN
4.0000 mg | Freq: Four times a day (QID) | INTRAMUSCULAR | Status: DC | PRN
  Administered 2012-02-20 (×2): 4 mg via INTRAVENOUS
  Filled 2012-02-19 (×2): qty 2

## 2012-02-19 MED ORDER — STERILE WATER FOR IRRIGATION IR SOLN
Status: DC | PRN
  Administered 2012-02-19: 6000 mL

## 2012-02-19 MED ORDER — PHENOL 1.4 % MT LIQD: 1.0000 | OROMUCOSAL | Status: DC | PRN

## 2012-02-19 MED ORDER — CHLORHEXIDINE GLUCONATE 4 % EX LIQD
60.0000 mL | Freq: Once | CUTANEOUS | Status: DC
  Filled 2012-02-19: qty 60

## 2012-02-19 MED ORDER — PANTOPRAZOLE SODIUM 40 MG PO TBEC
40.0000 mg | DELAYED_RELEASE_TABLET | Freq: Every day | ORAL | Status: DC
  Administered 2012-02-20 – 2012-02-21 (×2): 40 mg via ORAL
  Filled 2012-02-19 (×2): qty 1

## 2012-02-19 MED ORDER — GLYCOPYRROLATE 0.2 MG/ML IJ SOLN
0.2000 mg | Freq: Once | INTRAMUSCULAR | Status: AC
  Administered 2012-02-19: 0.2 mg via INTRAVENOUS

## 2012-02-19 MED ORDER — METOCLOPRAMIDE HCL 10 MG PO TABS: 5.0000 mg | ORAL_TABLET | Freq: Three times a day (TID) | ORAL | Status: DC | PRN

## 2012-02-19 MED ORDER — KETOROLAC TROMETHAMINE 30 MG/ML IJ SOLN
INTRAMUSCULAR | Status: DC | PRN
  Administered 2012-02-19: 30 mg via INTRAVENOUS

## 2012-02-19 MED ORDER — PROPOFOL INFUSION 10 MG/ML OPTIME
INTRAVENOUS | Status: DC | PRN
  Administered 2012-02-19: 50 ug/kg/min via INTRAVENOUS

## 2012-02-19 MED ORDER — HYDROMORPHONE HCL PF 1 MG/ML IJ SOLN
0.5000 mg | INTRAMUSCULAR | Status: DC | PRN
Start: 2012-02-19 — End: 2012-02-21
  Administered 2012-02-19 – 2012-02-20 (×5): 2 mg via INTRAVENOUS
  Administered 2012-02-20 (×2): 1 mg via INTRAVENOUS
  Filled 2012-02-19 (×2): qty 2
  Filled 2012-02-19: qty 1
  Filled 2012-02-19 (×3): qty 2
  Filled 2012-02-19: qty 1
  Filled 2012-02-19: qty 2

## 2012-02-19 MED ORDER — ASPIRIN 325 MG PO TABS
325.0000 mg | ORAL_TABLET | Freq: Every morning | ORAL | Status: DC
  Administered 2012-02-20 – 2012-02-21 (×2): 325 mg via ORAL
  Filled 2012-02-19 (×2): qty 1

## 2012-02-19 MED ORDER — ALBUTEROL SULFATE HFA 108 (90 BASE) MCG/ACT IN AERS
2.0000 | INHALATION_SPRAY | Freq: Four times a day (QID) | RESPIRATORY_TRACT | Status: DC | PRN
  Administered 2012-02-19: 2 via RESPIRATORY_TRACT
  Filled 2012-02-19: qty 6.7

## 2012-02-19 MED ORDER — PHENYLEPHRINE HCL 10 MG/ML IJ SOLN
INTRAMUSCULAR | Status: DC | PRN
  Administered 2012-02-19: 40 ug via INTRAVENOUS

## 2012-02-19 MED ORDER — MENTHOL 3 MG MT LOZG: 1.0000 | LOZENGE | OROMUCOSAL | Status: DC | PRN

## 2012-02-19 MED ORDER — ADULT MULTIVITAMIN W/MINERALS CH
1.0000 | ORAL_TABLET | Freq: Every day | ORAL | Status: DC
  Administered 2012-02-20 – 2012-02-21 (×2): 1 via ORAL
  Filled 2012-02-19 (×2): qty 1

## 2012-02-19 MED ORDER — LACTATED RINGERS IV SOLN: INTRAVENOUS | Status: DC

## 2012-02-19 MED ORDER — NITROGLYCERIN 0.4 MG SL SUBL: 0.4000 mg | SUBLINGUAL_TABLET | SUBLINGUAL | Status: DC | PRN

## 2012-02-19 MED ORDER — CEFAZOLIN SODIUM 1-5 GM-% IV SOLN
1.0000 g | Freq: Four times a day (QID) | INTRAVENOUS | Status: AC
  Administered 2012-02-19 – 2012-02-20 (×2): 1 g via INTRAVENOUS
  Filled 2012-02-19 (×2): qty 50

## 2012-02-19 MED ORDER — BUPIVACAINE-EPINEPHRINE PF 0.25-1:200000 % IJ SOLN
INTRAMUSCULAR | Status: DC | PRN
  Administered 2012-02-19: 50 mL

## 2012-02-19 MED ORDER — LACTATED RINGERS IV SOLN
INTRAVENOUS | Status: DC | PRN
  Administered 2012-02-19 (×2): via INTRAVENOUS

## 2012-02-19 MED ORDER — DOCUSATE SODIUM 100 MG PO CAPS
100.0000 mg | ORAL_CAPSULE | Freq: Two times a day (BID) | ORAL | Status: DC
  Administered 2012-02-19 – 2012-02-21 (×4): 100 mg via ORAL
  Filled 2012-02-19 (×5): qty 1

## 2012-02-19 SURGICAL SUPPLY — 60 items
ADH SKN CLS APL DERMABOND .7 (GAUZE/BANDAGES/DRESSINGS) ×1
BAG SPEC THK2 15X12 ZIP CLS (MISCELLANEOUS) ×1
BAG ZIPLOCK 12X15 (MISCELLANEOUS) ×2 IMPLANT
BANDAGE ELASTIC 6 VELCRO ST LF (GAUZE/BANDAGES/DRESSINGS) ×2 IMPLANT
BANDAGE ESMARK 6X9 LF (GAUZE/BANDAGES/DRESSINGS) ×1 IMPLANT
BLADE SAW SGTL 13.0X1.19X90.0M (BLADE) ×2 IMPLANT
BNDG CMPR 9X6 STRL LF SNTH (GAUZE/BANDAGES/DRESSINGS) ×1
BNDG ESMARK 6X9 LF (GAUZE/BANDAGES/DRESSINGS) ×2
BONE CEMENT GENTAMICIN (Cement) ×4 IMPLANT
BOWL SMART MIX CTS (DISPOSABLE) ×2 IMPLANT
CEMENT BONE GENTAMICIN 40 (Cement) IMPLANT
CLOTH BEACON ORANGE TIMEOUT ST (SAFETY) ×2 IMPLANT
CUFF TOURN SGL QUICK 34 (TOURNIQUET CUFF) ×2
CUFF TRNQT CYL 34X4X40X1 (TOURNIQUET CUFF) ×1 IMPLANT
DECANTER SPIKE VIAL GLASS SM (MISCELLANEOUS) ×2 IMPLANT
DERMABOND ADVANCED (GAUZE/BANDAGES/DRESSINGS) ×1
DERMABOND ADVANCED .7 DNX12 (GAUZE/BANDAGES/DRESSINGS) ×1 IMPLANT
DRAPE EXTREMITY T 121X128X90 (DRAPE) ×2 IMPLANT
DRAPE POUCH INSTRU U-SHP 10X18 (DRAPES) ×2 IMPLANT
DRAPE U-SHAPE 47X51 STRL (DRAPES) ×2 IMPLANT
DRSG AQUACEL AG ADV 3.5X10 (GAUZE/BANDAGES/DRESSINGS) ×2 IMPLANT
DRSG TEGADERM 4X4.75 (GAUZE/BANDAGES/DRESSINGS) ×2 IMPLANT
DRSG TEGADERM 8X12 (GAUZE/BANDAGES/DRESSINGS) ×1 IMPLANT
DURAPREP 26ML APPLICATOR (WOUND CARE) ×2 IMPLANT
ELECT REM PT RETURN 9FT ADLT (ELECTROSURGICAL) ×2
ELECTRODE REM PT RTRN 9FT ADLT (ELECTROSURGICAL) ×1 IMPLANT
EVACUATOR 1/8 PVC DRAIN (DRAIN) ×2 IMPLANT
FACESHIELD LNG OPTICON STERILE (SAFETY) ×10 IMPLANT
GAUZE SPONGE 2X2 8PLY STRL LF (GAUZE/BANDAGES/DRESSINGS) ×1 IMPLANT
GLOVE BIOGEL PI IND STRL 7.5 (GLOVE) ×1 IMPLANT
GLOVE BIOGEL PI IND STRL 8 (GLOVE) ×1 IMPLANT
GLOVE BIOGEL PI INDICATOR 7.5 (GLOVE) ×1
GLOVE BIOGEL PI INDICATOR 8 (GLOVE) ×1
GLOVE ECLIPSE 8.0 STRL XLNG CF (GLOVE) ×2 IMPLANT
GLOVE ORTHO TXT STRL SZ7.5 (GLOVE) ×5 IMPLANT
GOWN BRE IMP PREV XXLGXLNG (GOWN DISPOSABLE) ×3 IMPLANT
GOWN STRL NON-REIN LRG LVL3 (GOWN DISPOSABLE) ×2 IMPLANT
HANDPIECE INTERPULSE COAX TIP (DISPOSABLE) ×2
IMMOBILIZER KNEE 20 (SOFTGOODS) ×2
IMMOBILIZER KNEE 20 THIGH 36 (SOFTGOODS) IMPLANT
KIT BASIN OR (CUSTOM PROCEDURE TRAY) ×2 IMPLANT
MANIFOLD NEPTUNE II (INSTRUMENTS) ×2 IMPLANT
NDL SAFETY ECLIPSE 18X1.5 (NEEDLE) ×1 IMPLANT
NEEDLE HYPO 18GX1.5 SHARP (NEEDLE) ×2
NS IRRIG 1000ML POUR BTL (IV SOLUTION) ×4 IMPLANT
PACK TOTAL JOINT (CUSTOM PROCEDURE TRAY) ×2 IMPLANT
POSITIONER SURGICAL ARM (MISCELLANEOUS) ×2 IMPLANT
SET HNDPC FAN SPRY TIP SCT (DISPOSABLE) ×1 IMPLANT
SET PAD KNEE POSITIONER (MISCELLANEOUS) ×2 IMPLANT
SPONGE GAUZE 2X2 STER 10/PKG (GAUZE/BANDAGES/DRESSINGS) ×1
SUCTION FRAZIER 12FR DISP (SUCTIONS) ×2 IMPLANT
SUT MNCRL AB 4-0 PS2 18 (SUTURE) ×2 IMPLANT
SUT VIC AB 1 CT1 36 (SUTURE) ×6 IMPLANT
SUT VIC AB 2-0 CT1 27 (SUTURE) ×6
SUT VIC AB 2-0 CT1 TAPERPNT 27 (SUTURE) ×3 IMPLANT
SYR 50ML LL SCALE MARK (SYRINGE) ×2 IMPLANT
TOWEL OR 17X26 10 PK STRL BLUE (TOWEL DISPOSABLE) ×4 IMPLANT
TRAY FOLEY CATH 14FRSI W/METER (CATHETERS) ×2 IMPLANT
WATER STERILE IRR 1500ML POUR (IV SOLUTION) ×2 IMPLANT
WRAP KNEE MAXI GEL POST OP (GAUZE/BANDAGES/DRESSINGS) ×2 IMPLANT

## 2012-02-19 NOTE — Transfer of Care (Addendum)
Immediate Anesthesia Transfer of Care Note  Patient: Collin Campbell  Procedure(s) Performed: Procedure(s) (LRB): TOTAL KNEE ARTHROPLASTY (Left)  Patient Location: PACU  Anesthesia Type: Spinal  Level of Consciousness: sedated, patient cooperative and responds to stimulaton  Airway & Oxygen Therapy: Patient Spontanous Breathing and Patient connected to face mask oxgen  Post-op Assessment: Report given to PACU RN and Post -op Vital signs reviewed and stable  Post vital signs: Reviewed and stable  Complications: No apparent anesthesia complications  Pt able to move bilat lower ext., lift lower ext with sensory to toes.

## 2012-02-19 NOTE — Anesthesia Procedure Notes (Signed)
Spinal  Patient location during procedure: OR Staffing Performed by: anesthesiologist  Preanesthetic Checklist Completed: patient identified, site marked, surgical consent, pre-op evaluation, timeout performed, IV checked, risks and benefits discussed and monitors and equipment checked Spinal Block Patient position: sitting Prep: Betadine Patient monitoring: heart rate, continuous pulse ox and blood pressure Injection technique: single-shot Needle Needle type: Quincke  Needle gauge: 22 G Needle length: 9 cm Additional Notes Expiration date of kit checked and confirmed. Patient tolerated procedure well, without complications.     

## 2012-02-19 NOTE — Anesthesia Preprocedure Evaluation (Signed)
Anesthesia Evaluation  Patient identified by MRN, date of birth, ID band Patient awake    Reviewed: Allergy & Precautions, H&P , NPO status , Patient's Chart, lab work & pertinent test results  Airway Mallampati: II TM Distance: >3 FB Neck ROM: Full    Dental No notable dental hx.    Pulmonary COPD breath sounds clear to auscultation  Pulmonary exam normal       Cardiovascular hypertension, Pt. on medications Rhythm:Regular Rate:Normal     Neuro/Psych negative neurological ROS  negative psych ROS   GI/Hepatic Neg liver ROS, GERD-  Medicated,  Endo/Other  diabetes, Oral Hypoglycemic Agents  Renal/GU negative Renal ROS  negative genitourinary   Musculoskeletal negative musculoskeletal ROS (+)   Abdominal   Peds negative pediatric ROS (+)  Hematology negative hematology ROS (+)   Anesthesia Other Findings   Reproductive/Obstetrics negative OB ROS                           Anesthesia Physical Anesthesia Plan  ASA: III  Anesthesia Plan: Spinal   Post-op Pain Management:    Induction:   Airway Management Planned: Simple Face Mask  Additional Equipment:   Intra-op Plan:   Post-operative Plan:   Informed Consent: I have reviewed the patients History and Physical, chart, labs and discussed the procedure including the risks, benefits and alternatives for the proposed anesthesia with the patient or authorized representative who has indicated his/her understanding and acceptance.     Plan Discussed with: CRNA and Surgeon  Anesthesia Plan Comments:         Anesthesia Quick Evaluation

## 2012-02-19 NOTE — Op Note (Signed)
NAME:  Collin Campbell                      MEDICAL RECORD NO.:  [id number]                             FACILITY:  [facility]      PHYSICIAN:  Madlyn Frankel. Charlann Boxer, M.D.  DATE OF BIRTH:  02/18/1926      DATE OF PROCEDURE:  02/19/2012                                     OPERATIVE REPORT         PREOPERATIVE DIAGNOSIS:  Left knee osteoarthritis.      POSTOPERATIVE DIAGNOSIS:  Left knee osteoarthritis.      FINDINGS:  The patient was noted to have complete loss of cartilage and   bone-on-bone arthritis with associated osteophytes in all three compartments of   the knee with a significant synovitis and associated effusion.  Patient also noted to have pre-operative flexion contracture of about 5-10 degrees.     PROCEDURE:  Left total knee replacement.      COMPONENTS USED:  DePuy rotating platform posterior stabilized knee   system, a size 5 femur, 4 tibia, 10 mm PS insert, and 41 patellar   button.      SURGEON:  Madlyn Frankel. Charlann Boxer, M.D.      ASSISTANT:  Lanney Gins, PA-C.      ANESTHESIA:  Spinal.      SPECIMENS:  None.      COMPLICATION:  None.      DRAINS:  One Hemovac.  EBL: <150cc      TOURNIQUET TIME:   Total Tourniquet Time Documented: Thigh (Left) - 42 minutes .      The patient was stable to the recovery room.      INDICATION FOR PROCEDURE:  Collin Campbell is a 78 y.o. male patient of   mine.  The patient had been seen, evaluated, and treated conservatively in the   office with medication, activity modification, and injections.  The patient had   radiographic changes of bone-on-bone arthritis with endplate sclerosis and osteophytes noted.      The patient failed conservative measures including medication, injections, and activity modification, and at this point was ready for more definitive measures.   Based on the radiographic changes and failed conservative measures, the patient   decided to proceed with total knee replacement.  Risks of infection,   DVT,  component failure, need for revision surgery, postop course, and   expectations were all   discussed and reviewed.  Consent was obtained for benefit of pain   relief.      PROCEDURE IN DETAIL:  The patient was brought to the operative theater.   Once adequate anesthesia, preoperative antibiotics, 2 gm of Ancef administered, the patient was positioned supine with the left thigh tourniquet placed.  The  left lower extremity was prepped and draped in sterile fashion.  A time-   out was performed identifying the patient, planned procedure, and   extremity.      The left lower extremity was placed in the [facility] leg holder.  The leg was   exsanguinated, tourniquet elevated to 250 mmHg.  A midline incision was   made followed by median parapatellar arthrotomy.  Following initial  exposure, attention was first directed to the patella.  Precut   measurement was noted to be 26 mm.  I resected down to 15 mm and used a   41 patellar button to restore patellar height as well as cover the cut   surface.      The lug holes were drilled and a metal shim was placed to protect the   patella from retractors and saw blades.      At this point, attention was now directed to the femur.  The femoral   canal was opened with a drill, irrigated to try to prevent fat emboli.  An   intramedullary rod was passed at 3 degrees valgus, 11 mm of bone was   resected off the distal femur due to the pre-operative flexion contracture.  Following this resection, the tibia was   subluxated anteriorly.  Using the extramedullary guide, 10 mm of bone was resected off   the proximal lateral tibia.  We confirmed the gap would be   stable medially and laterally with a 10 mm insert as well as confirmed   the cut was perpendicular in the coronal plane, checking with an alignment rod.      Once this was done, I sized the femur to be a size 5 in the anterior-   posterior dimension, chose a standard component based on medial and    lateral dimension.  The size 5 rotation block was then pinned in   position anterior referenced using the C-clamp to set rotation.  The   anterior, posterior, and  chamfer cuts were made without difficulty nor   notching making certain that I was along the anterior cortex to help   with flexion gap stability.      The final box cut was made off the lateral aspect of distal femur.      At this point, the tibia was sized to be a size 4, the size 4 tray was   then pinned in position through the medial third of the tubercle,   drilled, and keel punched.  Trial reduction was now carried with a 5 femur,  4 tibia, a 10 mm insert, and the 41 patella botton.  The knee was brought to   extension, full extension with good flexion stability with the patella   tracking through the trochlea without application of pressure.  Given   all these findings, the trial components removed.  Final components were   opened and cement was mixed.  The knee was irrigated with normal saline   solution and pulse lavage.  The synovial lining was   then injected with 0.25% Marcaine with epinephrine and 1 cc of Toradol,   total of 61 cc.      The knee was irrigated.  Final implants were then cemented onto clean and   dried cut surfaces of bone with the knee brought to extension with a 10   mm trial insert.      Once the cement had fully cured, the excess cement was removed   throughout the knee.  I confirmed I was satisfied with the range of   motion and stability, and the final 10 mm PS insert was chosen.  It was   placed into the knee.      The tourniquet had been let down at 40 minutes.  No significant   hemostasis required.  The medium Hemovac drain was placed deep.  The   extensor mechanism was then reapproximated using #1  Vicryl with the knee   in flexion.  The   remaining wound was closed with 2-0 Vicryl and running 4-0 Monocryl.   The knee was cleaned, dried, dressed sterilely using Dermabond and   Aquacel  dressing.  Drain site dressed separately.  The patient was then   brought to recovery room in stable condition, tolerating the procedure   well.   Please note that Physician Assistant, Lanney Gins, was present for the entirety of the case, and was utilized for pre-operative positioning, peri-operative retractor management, general facilitation of the procedure.  He was also utilized for primary wound closure at the end of the case.              Madlyn Frankel Charlann Boxer, M.D.

## 2012-02-19 NOTE — Interval H&P Note (Signed)
History and Physical Interval Note:  02/19/2012 11:45 AM  Collin Campbell  has presented today for surgery, with the diagnosis of left knee osteoarthritis  The various methods of treatment have been discussed with the patient and family. After consideration of risks, benefits and other options for treatment, the patient has consented to  Procedure(s) (LRB) with comments: TOTAL KNEE ARTHROPLASTY (Left) as a surgical intervention .  The patient's history has been reviewed, patient examined, no change in status, stable for surgery.  I have reviewed the patient's chart and labs.  Questions were answered to the patient's satisfaction.     Shelda Pal

## 2012-02-19 NOTE — Anesthesia Postprocedure Evaluation (Signed)
Anesthesia Post Note  Patient: Collin Campbell  Procedure(s) Performed: Procedure(s) (LRB): TOTAL KNEE ARTHROPLASTY (Left)  Anesthesia type: Spinal  Patient location: PACU  Post pain: Pain level controlled  Post assessment: Post-op Vital signs reviewed  Last Vitals:  Filed Vitals:   02/19/12 1615  BP: 151/60  Pulse: 48  Temp: 36.4 C  Resp: 16    Post vital signs: Reviewed  Level of consciousness: sedated  Complications: No apparent anesthesia complications

## 2012-02-20 ENCOUNTER — Encounter (HOSPITAL_COMMUNITY): Payer: Self-pay | Admitting: Orthopedic Surgery

## 2012-02-20 DIAGNOSIS — Z96659 Presence of unspecified artificial knee joint: Secondary | ICD-10-CM

## 2012-02-20 LAB — BASIC METABOLIC PANEL
Calcium: 8.6 mg/dL (ref 8.4–10.5)
GFR calc Af Amer: 66 mL/min — ABNORMAL LOW (ref >90)
GFR calc non Af Amer: 57 mL/min — ABNORMAL LOW (ref >90)
Potassium: 4.4 mEq/L (ref 3.5–5.1)
Sodium: 141 mEq/L (ref 135–145)

## 2012-02-20 LAB — CBC
MCH: 32.7 pg (ref 26.0–34.0)
MCHC: 32.8 g/dL (ref 30.0–36.0)
Platelets: 209 10*3/uL (ref 150–400)
RDW: 13.3 % (ref 11.5–15.5)

## 2012-02-20 LAB — GLUCOSE, CAPILLARY: Glucose-Capillary: 90 mg/dL (ref 70–99)

## 2012-02-20 MED ORDER — POLYVINYL ALCOHOL 1.4 % OP SOLN
1.0000 [drp] | OPHTHALMIC | Status: DC | PRN
  Filled 2012-02-20: qty 15

## 2012-02-20 NOTE — Care Management Note (Signed)
    Page 1 of 1   02/21/2012     3:29:01 PM   CARE MANAGEMENT NOTE 02/21/2012  Patient:  Collin Campbell, Collin Campbell   Account Number:  [id number]  Date Initiated:  02/20/2012  Documentation initiated by:  Lanier Clam  Subjective/Objective Assessment:   ADMITTED W/OA L KNEE.     Action/Plan:   FROM HOME.LIVES IN [place].   Anticipated DC Date:  02/22/2012   Anticipated DC Plan:  HOME W HOME HEALTH SERVICES      DC Planning Services  CM consult      Choice offered to / List presented to:  C-1 Patient        HH arranged  HH-2 PT      [facility] agency  [facility]   Status of service:  Completed, signed off Medicare Important Message given?   (If response is "NO", the following Medicare IM given date fields will be blank) Date Medicare IM given:   Date Additional Medicare IM given:    Discharge Disposition:  HOME W HOME HEALTH SERVICES  Per UR Regulation:  Reviewed for med. necessity/level of care/duration of stay  If discussed at Long Length of Stay Meetings, dates discussed:    Comments:  02/21/12 Manvir Prabhu RN,BSN NCM [phone] POD#2.[facility] WILL PROVIDED HHPT,AWARE OF D/C.HOME TODAY.  02/20/12 Dwon Sky RN,BSN NCM [phone] POD#1 L TKR.PT/OT-HH.

## 2012-02-20 NOTE — Progress Notes (Signed)
Physical Therapy Treatment Patient Details Name: Collin Campbell MRN: [id number] DOB: 1925-09-18 Today's Date: 02/20/2012 Time: 8657-8469 PT Time Calculation (min): 25 min  PT Assessment / Plan / Recommendation Comments on Treatment Session  POD #1 pm session.  Pt in recliner c/o Max frontal lobe headache that he stated is lasting several hours.  Daughter in room and very helpful.  Amb deferred 2nd headache and 8/10 knee pain.  Assisted pt back to bed.    Follow Up Recommendations  Home health PT     Does the patient have the potential to tolerate intense rehabilitation     Barriers to Discharge        Equipment Recommendations  None recommended by PT    Recommendations for Other Services    Frequency 7X/week   Plan Discharge plan remains appropriate    Precautions / Restrictions Precautions Precautions: Knee Required Braces or Orthoses: Knee Immobilizer - Left Knee Immobilizer - Left: Discontinue once straight leg raise with < 10 degree lag Restrictions Weight Bearing Restrictions: No LLE Weight Bearing: Weight bearing as tolerated    Pertinent Vitals/Pain C/o 8/10 knee pain ICE applied C/o MAX frontal lobe headache/RN notified    Mobility  Bed Mobility Bed Mobility: Sit to Supine Supine to Sit: 4: Min assist;HOB elevated Sit to Supine: 4: Min assist Details for Bed Mobility Assistance: min assist to support L LE up in bed and increased time  Transfers Transfers: Sit to Stand;Stand to Sit Sit to Stand: 4: Min assist;From chair/3-in-1 Stand to Sit: 4: Min assist;To bed Details for Transfer Assistance: 25% VC's on safety and turn completion prior to sit as pt demon mild impulsiveness  Ambulation/Gait Ambulation/Gait Assistance: 4: Min assist Ambulation Distance (Feet): 15 Feet Assistive device: Rolling walker Ambulation/Gait Assistance Details: deferred this pm 2nd MAX c/o frontal lobe headache and 8/10 Knee pain.  RN notified and pain meds requested. Gait  Pattern: Step-to pattern;Decreased step length - left;Decreased stance time - left;Antalgic Gait velocity: decreased       Visit Information  Last PT Received On: 02/20/12 Assistance Needed: +1    Subjective Data  Subjective: I don't know if I can walk. Patient Stated Goal: to go home   Cognition  Overall Cognitive Status: Appears within functional limits for tasks assessed/performed Arousal/Alertness: Awake/alert Orientation Level: Appears intact for tasks assessed Behavior During Session: [facility] for tasks performed    Balance   fair  End of Session PT - End of Session Equipment Utilized During Treatment: Left knee immobilizer Activity Tolerance: Patient limited by pain;Patient limited by fatigue Patient left: in bed;with call bell/phone within reach;with family/visitor present Nurse Communication: Other (comment) (headache and knee pain)  [name]  PTA [facility]  Acute  Rehab Pager     [phone]

## 2012-02-20 NOTE — Evaluation (Signed)
Physical Therapy Evaluation Patient Details Name: Collin Campbell MRN: [id number] DOB: 12-03-1925 Today's Date: 02/20/2012 Time: 5621-3086 PT Time Calculation (min): 27 min  PT Assessment / Plan / Recommendation Clinical Impression  Pt. had LTKA on 02/19/12. Had RTKA in august. Pt. tolerated well even with pain. Pt. 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 Pt. will benefit from PT.    PT Assessment  Patient needs continued PT services    Follow Up Recommendations  Home health PT    Does the patient have the potential to tolerate intense rehabilitation      Barriers to Discharge        Equipment Recommendations  None recommended by PT    Recommendations for Other Services     Frequency 7X/week    Precautions / Restrictions Precautions Precautions: Knee Required Braces or Orthoses: Knee Immobilizer - Left Knee Immobilizer - Left: Discontinue once straight leg raise with < 10 degree lag Restrictions Weight Bearing Restrictions: No LLE Weight Bearing: Weight bearing as tolerated   Pertinent Vitals/Pain 8/10, ice applied and RN notified.      Mobility  Bed Mobility Bed Mobility: Supine to Sit Supine to Sit: 4: Min assist;HOB elevated Details for Bed Mobility Assistance: min verbal cues for sequence/technique and support of L LE. Transfers Sit to Stand: 4: Min assist;With upper extremity assist;From bed Stand to Sit: 4: Min assist;With upper extremity assist;To chair/3-in-1 Details for Transfer Assistance: min verbal cues for hand placement and assist to rise and stabilize and then to control descent to sit. Ambulation/Gait Ambulation/Gait Assistance: 4: Min assist Ambulation Distance (Feet): 15 Feet Assistive device: Rolling walker Ambulation/Gait Assistance Details: verbal cues for sequence and safety, pt. likes to joke and is distracted from activity. Gait Pattern: Step-to pattern;Decreased step length - left;Decreased stance time - left;Antalgic Gait velocity:  decreased    Shoulder Instructions     Exercises     PT Diagnosis: Difficulty walking;Acute pain  PT Problem List: Decreased strength;Decreased range of motion;Decreased safety awareness;Decreased activity tolerance;Decreased mobility;Decreased knowledge of precautions;Pain PT Treatment Interventions: DME instruction;Gait training;Functional mobility training;Therapeutic activities;Therapeutic exercise;Patient/family education   PT Goals Acute Rehab PT Goals PT Goal Formulation: With patient Time For Goal Achievement: 02/27/12 Potential to Achieve Goals: Good Pt will go Supine/Side to Sit: with supervision PT Goal: Supine/Side to Sit - Progress: Goal set today Pt will go Sit to Supine/Side: with supervision PT Goal: Sit to Supine/Side - Progress: Goal set today Pt will go Sit to Stand: with supervision PT Goal: Sit to Stand - Progress: Goal set today Pt will go Stand to Sit: with supervision PT Goal: Stand to Sit - Progress: Goal set today Pt will Ambulate: 51 - 150 feet;with supervision;with rolling walker PT Goal: Ambulate - Progress: Goal set today Pt will Perform Home Exercise Program: with supervision, verbal cues required/provided PT Goal: Perform Home Exercise Program - Progress: Goal set today  Visit Information  Last PT Received On: 02/20/12 Assistance Needed: +1    Subjective Data  Subjective: I don't know if I can walk. Patient Stated Goal: to go home   Prior Functioning  Home Living Lives With: Spouse Available Help at Discharge: Other (Comment) (wife available "most of the time.") Type of Home: House Home Access: Ramped entrance Home Layout: One level Bathroom Shower/Tub: Health visitor: Standard Home Adaptive Equipment: Shower chair without back;Bedside commode/3-in-1;Walker - rolling;Straight cane Prior Function Level of Independence: Independent with assistive device(s) (intermittantly used cane inside and out of house) Driving:  Yes Communication Communication: No difficulties Dominant Hand: Right    Cognition  Overall Cognitive Status: Appears within functional limits for tasks assessed/performed Arousal/Alertness: Awake/alert Orientation Level: Appears intact for tasks assessed Behavior During Session: [facility] for tasks performed    Extremity/Trunk Assessment Right Upper Extremity Assessment RUE ROM/Strength/Tone: [facility] for tasks assessed Left Upper Extremity Assessment LUE ROM/Strength/Tone: WFL for tasks assessed Right Lower Extremity Assessment RLE ROM/Strength/Tone: WFL for tasks assessed RLE Sensation: WFL - Light Touch Left Lower Extremity Assessment LLE ROM/Strength/Tone: Deficits LLE ROM/Strength/Tone Deficits: pt is able to  PERFORM SLR X 10 INCHES LLE Sensation: WFL - Light Touch   Balance    End of Session PT - End of Session Equipment Utilized During Treatment: Left knee immobilizer Activity Tolerance: Patient tolerated treatment well Patient left: in bed;with call bell/phone within reach;with chair alarm set Nurse Communication: Mobility status, need pain med.  GP     Rada Hay 02/20/2012, 12:43 PM  [phone]

## 2012-02-20 NOTE — Progress Notes (Signed)
Utilization review completed.  

## 2012-02-20 NOTE — Evaluation (Signed)
Occupational Therapy Evaluation Patient Details Name: Collin Campbell MRN: [id number] DOB: 1925-05-31 Today's Date: 02/20/2012 Time: 4098-1191 OT Time Calculation (min): 31 min  OT Assessment / Plan / Recommendation Clinical Impression  Pt is s/p L TKA and displays decreased activity tolerance, increased pain, and overall decreased independence with ADL. Will benefit from skilled OT services to improve independence and safety with ADL.    OT Assessment  Patient needs continued OT Services    Follow Up Recommendations  Home health OT;Supervision/Assistance - 24 hour    Barriers to Discharge      Equipment Recommendations  None recommended by OT    Recommendations for Other Services    Frequency  Min 2X/week    Precautions / Restrictions Precautions Precautions: Fall Required Braces or Orthoses: Knee Immobilizer - Left Knee Immobilizer - Left: Discontinue once straight leg raise with < 10 degree lag Restrictions Weight Bearing Restrictions: No LLE Weight Bearing: Weight bearing as tolerated        ADL  Eating/Feeding: Simulated;Independent Where Assessed - Eating/Feeding: Chair Grooming: Simulated;Wash/dry hands;Set up Where Assessed - Grooming: Supported sitting Upper Body Bathing: Simulated;Chest;Right arm;Left arm;Abdomen;Set up;Supervision/safety Where Assessed - Upper Body Bathing: Unsupported sitting Lower Body Bathing: Simulated;Minimal assistance;Moderate assistance Where Assessed - Lower Body Bathing: Supported sit to stand Upper Body Dressing: Simulated;Supervision/safety;Set up Where Assessed - Upper Body Dressing: Unsupported sitting Lower Body Dressing: Simulated;Moderate assistance Where Assessed - Lower Body Dressing: Supported sit to stand Toilet Transfer: Simulated;Minimal assistance Toileting - Clothing Manipulation and Hygiene: Simulated;Minimal assistance;Moderate assistance Where Assessed - Toileting Clothing Manipulation and Hygiene:  Standing Tub/Shower Transfer Method: Not assessed Equipment Used: Rolling walker ADL Comments: Pt had opposite knee surgery in June of this year and states he did well after surgery and was able to discharge home.    OT Diagnosis: Generalized weakness;Acute pain  OT Problem List: Decreased strength;Decreased knowledge of use of DME or AE;Pain;Decreased activity tolerance OT Treatment Interventions: Self-care/ADL training;Therapeutic activities;DME and/or AE instruction;Patient/family education   OT Goals Acute Rehab OT Goals OT Goal Formulation: With patient Time For Goal Achievement: 02/27/12 Potential to Achieve Goals: Good ADL Goals Pt Will Perform Grooming: with supervision;Standing at sink ADL Goal: Grooming - Progress: Goal set today Pt Will Perform Lower Body Bathing: with supervision;Sit to stand from chair;Sit to stand from bed;Sit to stand in shower ADL Goal: Lower Body Bathing - Progress: Goal set today Pt Will Perform Lower Body Dressing: with supervision;Sit to stand from bed;Sit to stand from chair ADL Goal: Lower Body Dressing - Progress: Goal set today Pt Will Transfer to Toilet: with supervision;Ambulation;with DME;3-in-1 ADL Goal: Toilet Transfer - Progress: Goal set today Pt Will Perform Toileting - Clothing Manipulation: with supervision;Standing ADL Goal: Toileting - Clothing Manipulation - Progress: Goal set today Pt Will Perform Tub/Shower Transfer: with min assist;Shower transfer ADL Goal: Tub/Shower Transfer - Progress: Goal set today  Visit Information  Last OT Received On: 02/20/12 Assistance Needed: +1 PT/OT Co-Evaluation/Treatment: Yes    Subjective Data  Subjective: I dont know about gettting into that recliner Patient Stated Goal: agreeable to get up to chair after encouragement   Prior Functioning     Home Living Lives With: Spouse Available Help at Discharge: Other (Comment) (wife available "most of the time.") Type of Home: House Home  Access: Ramped entrance Home Layout: One level Bathroom Shower/Tub: Health visitor: Standard Home Adaptive Equipment: Shower chair without back;Bedside commode/3-in-1;Walker - rolling;Straight cane Prior Function Level of Independence: Independent with assistive device(s) (intermittantly used cane  inside and out of house) Driving: Yes Communication Communication: No difficulties Dominant Hand: Right         Vision/Perception     Cognition  Overall Cognitive Status: Appears within functional limits for tasks assessed/performed Arousal/Alertness: Awake/alert Orientation Level: Appears intact for tasks assessed Behavior During Session: [facility] for tasks performed    Extremity/Trunk Assessment Right Upper Extremity Assessment RUE ROM/Strength/Tone: [facility] for tasks assessed Left Upper Extremity Assessment LUE ROM/Strength/Tone: [facility] for tasks assessed     Mobility Bed Mobility Bed Mobility: Supine to Sit Supine to Sit: 4: Min assist;HOB elevated Details for Bed Mobility Assistance: min verbal cues for sequence/technique and support of L LE. Transfers Transfers: Sit to Stand;Stand to Sit Sit to Stand: 4: Min assist;With upper extremity assist;From bed Stand to Sit: 4: Min assist;With upper extremity assist;To chair/3-in-1 Details for Transfer Assistance: min verbal cues for hand placement and assist to rise and stabilize and then to control descent to sit.     Shoulder Instructions     Exercise     Balance     End of Session OT - End of Session Activity Tolerance: Patient limited by pain Patient left: in chair;with call bell/phone within reach;with chair alarm set  GO     Lennox Laity [phone] 02/20/2012, 12:30 PM

## 2012-02-21 LAB — CBC
HCT: 27 % — ABNORMAL LOW (ref 39.0–52.0)
MCH: 32.6 pg (ref 26.0–34.0)
MCHC: 33 g/dL (ref 30.0–36.0)
MCV: 98.9 fL (ref 78.0–100.0)
Platelets: 225 10*3/uL (ref 150–400)
RDW: 13.2 % (ref 11.5–15.5)

## 2012-02-21 LAB — GLUCOSE, CAPILLARY: Glucose-Capillary: 130 mg/dL — ABNORMAL HIGH (ref 70–99)

## 2012-02-21 LAB — BASIC METABOLIC PANEL
BUN: 10 mg/dL (ref 6–23)
Calcium: 8.4 mg/dL (ref 8.4–10.5)
Creatinine, Ser: 1.08 mg/dL (ref 0.50–1.35)
GFR calc Af Amer: 71 mL/min — ABNORMAL LOW (ref >90)

## 2012-02-21 MED ORDER — HYDROCODONE-ACETAMINOPHEN 7.5-325 MG PO TABS: 1.0000 | ORAL_TABLET | ORAL | Status: DC | PRN

## 2012-02-21 MED ORDER — METHOCARBAMOL 500 MG PO TABS: 500.0000 mg | ORAL_TABLET | Freq: Four times a day (QID) | ORAL | Status: DC

## 2012-02-21 MED ORDER — ASPIRIN 325 MG PO TABS: 325.0000 mg | ORAL_TABLET | Freq: Two times a day (BID) | ORAL | Status: DC

## 2012-02-21 NOTE — Progress Notes (Signed)
Patient ID: Collin Campbell, male   DOB: 04-Feb-1926, 78 y.o.   MRN: [id number] Subjective: 2 Days Post-Op Procedure(s) (LRB): TOTAL KNEE ARTHROPLASTY (Left)    Patient reports pain as moderate.  Very tough man, tolerating his surgery well, ready to go home and continue therapy there as he had done with first one on the right  Objective:   VITALS:   Filed Vitals:   02/21/12 0539  BP: 121/81  Pulse: 87  Temp: 99.8 F (37.7 C)  Resp: 18    Neurovascular intact Incision: dressing C/D/I Working on improving flexion contracture  LABS  Basename 02/21/12 0405 02/20/12 0415  HGB 8.9* 8.8*  HCT 27.0* 26.8*  WBC 8.7 5.0  PLT 225 209     Basename 02/21/12 0405 02/20/12 0415  NA 135 141  K 4.1 4.4  BUN 10 11  CREATININE 1.08 1.15  GLUCOSE 131* 90    No results found for this basename: LABPT:2,INR:2 in the last 72 hours   Assessment/Plan: 2 Days Post-Op Procedure(s) (LRB): TOTAL KNEE ARTHROPLASTY (Left)   Up with therapy Discharge home with home health after therapy D/C instructions reviewed as he has been through this recently

## 2012-02-21 NOTE — Progress Notes (Signed)
Physical Therapy Treatment Patient Details Name: Collin Campbell MRN: [id number] DOB: 1925-12-24 Today's Date: 02/21/2012 Time: 1308-6578 PT Time Calculation (min): 24 min  PT Assessment / Plan / Recommendation Comments on Treatment Session  pt is ready for DC. RN aware.    Follow Up Recommendations  Home health PT     Does the patient have the potential to tolerate intense rehabilitation     Barriers to Discharge        Equipment Recommendations  None recommended by PT    Recommendations for Other Services    Frequency 7X/week   Plan Discharge plan remains appropriate;Frequency remains appropriate    Precautions / Restrictions Precautions Precautions: Knee Required Braces or Orthoses: Knee Immobilizer - Left Restrictions Weight Bearing Restrictions: No LLE Weight Bearing: Weight bearing as tolerated   Pertinent Vitals/Pain *8/10. Ice applied.   Mobility  Bed Mobility Bed Mobility: Sit to Supine Supine to Sit: 5: Supervision;HOB elevated Sit to Supine: 4: Min assist Details for Bed Mobility Assistance: asssit for lle onto bed Transfers Sit to Stand: 4: Min guard;From chair/3-in-1 Stand to Sit: To bed;4: Min guard Details for Transfer Assistance: pt did well with remembering to push up from surface today Ambulation/Gait Ambulation/Gait Assistance: 4: Min guard Ambulation Distance (Feet): 50 Feet Assistive device: Rolling walker Ambulation/Gait Assistance Details: VC for sequence but has good gait speed and safe. Gait Pattern: Antalgic;Step-to pattern;Decreased step length - left;Decreased stance time - left    Exercises Total Joint Exercises Quad Sets: AROM;Left;10 reps;Supine Heel Slides: AAROM;Left;10 reps;Supine Hip ABduction/ADduction: AAROM;Left;10 reps;Supine Straight Leg Raises: AAROM;Left;10 reps;Supine   PT Diagnosis:    PT Problem List:   PT Treatment Interventions:     PT Goals Acute Rehab PT Goals Pt will go Sit to Supine/Side: with  supervision PT Goal: Sit to Supine/Side - Progress: Progressing toward goal Pt will go Sit to Stand: with supervision PT Goal: Sit to Stand - Progress: Progressing toward goal Pt will go Stand to Sit: with supervision PT Goal: Stand to Sit - Progress: Progressing toward goal Pt will Ambulate: 51 - 150 feet;with supervision;with rolling walker PT Goal: Ambulate - Progress: Progressing toward goal Pt will Perform Home Exercise Program: with supervision, verbal cues required/provided PT Goal: Perform Home Exercise Program - Progress: Progressing toward goal  Visit Information  Last PT Received On: 02/21/12 Assistance Needed: +1    Subjective Data  Subjective: It is hurting more.   Cognition  Overall Cognitive Status: Appears within functional limits for tasks assessed/performed Arousal/Alertness: Awake/alert Orientation Level: Appears intact for tasks assessed Behavior During Session: [facility] for tasks performed    Balance     End of Session PT - End of Session Activity Tolerance: Patient tolerated treatment well Patient left: in bed;with call bell/phone within reach;with bed alarm set Nurse Communication: Mobility status   GP     Rada Hay 02/21/2012, 12:57 PM

## 2012-02-21 NOTE — Progress Notes (Signed)
Pt had a 2.38 sec pause. Pt was asymptomatic. MD aware will continue to monitor the patient. Roosvelt Maser, RN

## 2012-02-21 NOTE — Progress Notes (Signed)
Occupational Therapy Treatment Patient Details Name: Collin Campbell MRN: [id number] DOB: Jun 18, 1925 Today's Date: 02/21/2012 Time: 1191-4782 OT Time Calculation (min): 25 min  OT Assessment / Plan / Recommendation Comments on Treatment Session Pt tolerated session well. Supposed to discharge later today. Has DME.     Follow Up Recommendations  Home health OT;Supervision/Assistance - 24 hour    Barriers to Discharge       Equipment Recommendations  None recommended by PT;None recommended by OT    Recommendations for Other Services    Frequency Min 2X/week   Plan Discharge plan remains appropriate    Precautions / Restrictions Precautions Precautions: Knee Required Braces or Orthoses:  (did SLR this am. didnt use KI) Restrictions Weight Bearing Restrictions: No LLE Weight Bearing: Weight bearing as tolerated        ADL  Toilet Transfer: Performed;Minimal assistance Toilet Transfer Equipment: Raised toilet seat with arms (or 3-in-1 over toilet) Toileting - Clothing Manipulation and Hygiene: Simulated;Minimal assistance Where Assessed - Toileting Clothing Manipulation and Hygiene: Sit to stand from 3-in-1 or toilet Equipment Used: Rolling walker ADL Comments: Pt was min guard to occassional min assist with toilet transfer. At times he would push the walker too far in front of him but he was able to self correct. Min assist for safety and to stabilize when he pushed it too far. Pt declines practicing shower transfer due to high level of pain (10 out of 10) and states he will start off with sponge bathing like last time. Encouraged pt to let [facility] assess shower transfer before he attempts on his own. Pt agreeable.     OT Diagnosis:    OT Problem List:   OT Treatment Interventions:     OT Goals ADL Goals ADL Goal: Toilet Transfer - Progress: Progressing toward goals (better with sit to stand component)  Visit Information  Last OT Received On: 02/21/12 Assistance Needed:  +1    Subjective Data  Subjective: my head is still hurting Patient Stated Goal: agreeable to work with OT; none stated on own   Prior Functioning       Cognition  Overall Cognitive Status: Appears within functional limits for tasks assessed/performed Arousal/Alertness: Awake/alert Orientation Level: Appears intact for tasks assessed Behavior During Session: [facility] for tasks performed    Mobility  Shoulder Instructions Bed Mobility Bed Mobility: Supine to Sit Supine to Sit: 5: Supervision;HOB elevated Details for Bed Mobility Assistance: increased time. Transfers Transfers: Sit to Stand;Stand to Sit Sit to Stand: 4: Min guard;With upper extremity assist;From chair/3-in-1;From bed Stand to Sit: 4: Min guard;With upper extremity assist;To chair/3-in-1 Details for Transfer Assistance: pt did well with remembering to push up from surface today.       Exercises      Balance     End of Session OT - End of Session Activity Tolerance: Patient limited by pain Patient left: in chair;with call bell/phone within reach;with chair alarm set  GO     Lennox Laity [phone] 02/21/2012, 9:47 AM

## 2012-02-21 NOTE — Progress Notes (Signed)
Physical Therapy Treatment Patient Details Name: Collin Campbell MRN: [id number] DOB: 20-Jul-1925 Today's Date: 02/21/2012 Time: 1191-4782 PT Time Calculation (min): 10 min  PT Assessment / Plan / Recommendation Comments on Treatment Session  Pt plans to DC today. will return for gait training.    Follow Up Recommendations  Home health PT     Does the patient have the potential to tolerate intense rehabilitation     Barriers to Discharge        Equipment Recommendations  None recommended by PT    Recommendations for Other Services    Frequency 7X/week   Plan Discharge plan remains appropriate;Frequency remains appropriate    Precautions / Restrictions Precautions Precautions: Knee Required Braces or Orthoses: Knee Immobilizer - Left Restrictions Weight Bearing Restrictions: No LLE Weight Bearing: Weight bearing as tolerated   Pertinent Vitals/Pain 4/10 L knee after meds.    Mobility    Exercises Total Joint Exercises Quad Sets: AROM;Left;10 reps;Supine Heel Slides: AAROM;Left;10 reps;Supine Hip ABduction/ADduction: AAROM;Left;10 reps;Supine Straight Leg Raises: AAROM;Left;10 reps;Supine   PT Diagnosis:    PT Problem List:   PT Treatment Interventions:     PT Goals Acute Rehab PT Goals Pt will Perform Home Exercise Program: with supervision, verbal cues required/provided PT Goal: Perform Home Exercise Program - Progress: Progressing toward goal  Visit Information  Last PT Received On: 02/21/12 Assistance Needed: +1    Subjective Data  Subjective: i am going home today.   Cognition  Overall Cognitive Status: Appears within functional limits for tasks assessed/performed Arousal/Alertness: Awake/alert Orientation Level: Appears intact for tasks assessed Behavior During Session: [facility] for tasks performed    Balance     End of Session PT - End of Session Activity Tolerance: Patient tolerated treatment well;Patient limited by pain Patient left: in  chair;with call bell/phone within reach;with chair alarm set Nurse Communication: Mobility status   GP     Rada Hay 02/21/2012, 12:53 PM

## 2012-02-22 DIAGNOSIS — D500 Iron deficiency anemia secondary to blood loss (chronic): Secondary | ICD-10-CM

## 2012-02-22 LAB — GLUCOSE, CAPILLARY: Glucose-Capillary: 147 mg/dL — ABNORMAL HIGH (ref 70–99)

## 2012-02-22 NOTE — Discharge Summary (Signed)
Physician Discharge Summary  Patient ID: Collin Campbell MRN: [id number] DOB/AGE: February 12, 1926 78 y.o.  Admit date: 02/19/2012 Discharge date: 02/21/2012   Procedures:  Procedure(s) (LRB): TOTAL KNEE ARTHROPLASTY (Left)  Attending Physician:  Dr. Durene Romans   Admission Diagnoses:   Left knee pain / OA  Discharge Diagnoses:  Principal Problem:  *S/P left TKA Hypercholesterolemia   Dysrhythmia - "irregular after surgery"   Diabetes mellitus   COPD (chronic obstructive pulmonary disease)   Shortness of breath with exertion   Asthma   Pneumonia x 2 years ago  GERD (gastroesophageal reflux disease)   H/O hiatal hernia   Headache   Arthritis   Anemia   Glaucoma   Peptic ulcer disease   Chronic kidney disease - 08/14/11   Hypertension   Sleep apnea    HPI: Collin Campbell, 78 y.o. male, has a history of pain and functional disability in the left knee due to arthritis and has failed non-surgical conservative treatments for greater than 12 weeks to includeNSAID's and/or analgesics and corticosteriod injections. Onset of symptoms was gradual, starting 4 years ago with gradually worsening course since that time. The patient noted no past surgery on the left knee(s). Patient currently rates pain in the left knee(s) at 8 out of 10 with activity. Patient has pain that interferes with activities of daily living and crepitus. Patient has evidence of periarticular osteophytes and joint space narrowing by imaging studies. Risks, benefits and expectations were discussed with the patient. Patient understand the risks, benefits and expectations and wishes to proceed with surgery.   PCP: Bennie Pierini, FNP   Discharged Condition: good  Hospital Course:  Patient underwent the above stated procedure on 02/19/2012. Patient tolerated the procedure well and brought to the recovery room in good condition and subsequently to the floor.  POD #1 BP: 150/67 ; Pulse: 63 ; Temp: 98.2 F (36.8  C) ; Resp: 16  Pt's foley was removed, as well as the hemovac drain removed. IV was changed to a saline lock. Patient reports pain as mild, pain well controlled. No events throughout the night.  Neurovascular intact, dorsiflexion/plantar flexion intact, incision: dressing C/D/I, no cellulitis present and compartment soft.   LABS  Basename  02/20/12 0415   HGB  8.8  HCT  26.8   POD #2  BP: 121/81 ; Pulse: 87 ; Temp: 99.8 F (37.7 C) ; Resp: 18  Patient reports pain as moderate. Very tough man, tolerating his surgery well, ready to go home and continue therapy there as he had done with first one on the right. Ready to be discharged home. Neurovascular intact, dorsiflexion/plantar flexion intact, incision: dressing C/D/I, no cellulitis present and compartment soft.   LABS  Basename  02/21/12 0405  HGB  8.9  HCT  27.0    Discharge Exam: General appearance: alert, cooperative and no distress Extremities: Homans sign is negative, no sign of DVT, no edema, redness or tenderness in the calves or thighs and no ulcers, gangrene or trophic changes  Disposition: Home or Self Care with follow up in 2 weeks   Follow-up Information    Follow up with Shelda Pal, MD. In 2 weeks.   Contact information:   [facility] [address] [place] [address] [phone]          Discharge Orders    Future Orders Please Complete By Expires   Diet - low sodium heart healthy      Call MD / Call 911  Comments:   If you experience chest pain or shortness of breath, CALL 911 and be transported to the hospital emergency room.  If you develope a fever above 101 F, pus (white drainage) or increased drainage or redness at the wound, or calf pain, call your surgeon's office.   Discharge instructions      Comments:   Maintain surgical dressing for 10-14 days, then replace with gauze and tape. Keep the area dry and clean until follow up. Follow up in 2 weeks at  [facility]. Call with any questions or concerns.   Constipation Prevention      Comments:   Drink plenty of fluids.  Prune juice may be helpful.  You may use a stool softener, such as Colace (over the counter) 100 mg twice a day.  Use MiraLax (over the counter) for constipation as needed.   Increase activity slowly as tolerated      Driving restrictions      Comments:   No driving for 4 weeks   TED hose      Comments:   Use stockings (TED hose) for 2 weeks on both leg(s).  You may remove them at night for sleeping.   Change dressing      Comments:   Maintain surgical dressing for 10-14 days, then change the dressing daily with sterile 4 x 4 inch gauze dressing and tape. Keep the area dry and clean.      Discharge Medication List as of 02/21/2012 11:48 AM    CONTINUE these medications which have CHANGED   Details  aspirin 325 MG tablet Take 1 tablet (325 mg total) by mouth 2 (two) times daily., Starting 02/21/2012, Until Discontinued, No Print    HYDROcodone-acetaminophen (NORCO) 7.5-325 MG per tablet Take 1-2 tablets by mouth every 4 (four) hours as needed for pain., Starting 02/21/2012, Until Discontinued, Print    methocarbamol (ROBAXIN) 500 MG tablet Take 1 tablet (500 mg total) by mouth every 6 (six) hours., Starting 02/21/2012, Until Discontinued, No Print      CONTINUE these medications which have NOT CHANGED   Details  albuterol (PROVENTIL HFA;VENTOLIN HFA) 108 (90 BASE) MCG/ACT inhaler Inhale 2 puffs into the lungs every 6 (six) hours as needed., Until Discontinued, Historical Med    budesonide-formoterol (SYMBICORT) 80-4.5 MCG/ACT inhaler Inhale 1 puff into the lungs 2 (two) times daily. , Until Discontinued, Historical Med    castor oil liquid Take 30 mLs by mouth daily as needed. For constipation, Until Discontinued, Historical Med    Cholecalciferol (VITAMIN D3) 2000 UNITS TABS Take 1 tablet by mouth daily. , Until Discontinued, Historical Med      cloNIDine (CATAPRES) 0.3 MG tablet Take 0.3 mg by mouth 2 (two) times daily., Until Discontinued, Historical Med    docusate sodium (COLACE) 100 MG capsule Take 100 mg by mouth 2 (two) times daily., Until Discontinued, Historical Med    fenofibrate 160 MG tablet Take 160 mg by mouth daily with breakfast. , Until Discontinued, Historical Med    ferrous sulfate 325 (65 FE) MG tablet Take 1 tablet (325 mg total) by mouth 3 (three) times daily after meals., Starting 11/01/2011, Until Fri 10/31/12, No Print    finasteride (PROSCAR) 5 MG tablet Take 5 mg by mouth daily with breakfast. , Until Discontinued, Historical Med    loratadine (CLARITIN) 10 MG tablet Take 10 mg by mouth at bedtime., Until Discontinued, Historical Med    losartan-hydrochlorothiazide (HYZAAR) 100-25 MG per tablet Take 1 tablet by mouth daily  with breakfast. , Until Discontinued, Historical Med    metFORMIN (GLUCOPHAGE) 500 MG tablet Take 500 mg by mouth 2 (two) times daily with a meal. , Until Discontinued, Historical Med    Multiple Vitamin (MULTIVITAMIN WITH MINERALS) TABS Take 0.5 tablets by mouth 2 (two) times daily., Until Discontinued, Historical Med    NIFEdipine (PROCARDIA XL/ADALAT-CC) 30 MG 24 hr tablet Take 30 mg by mouth daily with breakfast., Until Discontinued, Historical Med    pantoprazole (PROTONIX) 40 MG tablet Take 40 mg by mouth daily with breakfast. , Until Discontinued, Historical Med    simvastatin (ZOCOR) 40 MG tablet Take 40 mg by mouth at bedtime. , Until Discontinued, Historical Med    Tamsulosin HCl (FLOMAX) 0.4 MG CAPS Take 0.4 mg by mouth daily after supper. , Until Discontinued, Historical Med    Zinc 50 MG CAPS Take 1 capsule by mouth daily with breakfast., Until Discontinued, Historical Med    nitroGLYCERIN (NITROSTAT) 0.4 MG SL tablet Place 0.4 mg under the tongue every 5 (five) minutes as needed. For chest pain, Until Discontinued, Historical Med         Signed: Anastasio Auerbach. Andruw Battie    PAC  02/22/2012, 9:01 AM

## 2012-02-22 NOTE — Progress Notes (Signed)
   Subjective: 1 Days Post-Op Procedure(s) (LRB): TOTAL KNEE ARTHROPLASTY (Left)   Patient reports pain as mild, pain well controlled. No events throughout the night.   Objective:   VITALS:   Filed Vitals:   02/20/12 0549  BP: 150/67  Pulse: 63  Temp: 98.2 F (36.8 C)  Resp: 16    Neurovascular intact Dorsiflexion/Plantar flexion intact Incision: dressing C/D/I No cellulitis present Compartment soft  LABS  Basename 02/20/12 0415  HGB 8.8*  HCT 26.8*  WBC 5.0  PLT 209     Basename 02/20/12 0415  NA 141  K 4.4  BUN 11  CREATININE 1.15  GLUCOSE 90     Assessment/Plan: 1 Days Post-Op Procedure(s) (LRB): TOTAL KNEE ARTHROPLASTY (Left) HV drain d/c'ed Foley cath d/c'ed Advance diet Up with therapy D/C IV fluids Discharge home with home health, eventually when ready  Expected ABLA  Treated with iron and will observe  Overweight (BMI 25-29.9)  Estimated Body mass index is 29.82 kg/(m^2) as calculated from the following:   Height as of this encounter: 5' 5.5"(1.664 m).   Weight as of this encounter: 181 lb 15.8 oz(82.55 kg). Patient also counseled that weight may inhibit the healing process Patient counseled that losing weight will help with future health issues     Anastasio Auerbach. Young Brim   PAC  02/22/2012, 9:04 AM

## 2012-08-18 ENCOUNTER — Other Ambulatory Visit: Payer: Self-pay | Admitting: *Deleted

## 2012-08-18 MED ORDER — FENOFIBRATE 160 MG PO TABS: 160.0000 mg | ORAL_TABLET | Freq: Every day | ORAL | Status: DC

## 2012-08-18 MED ORDER — METFORMIN HCL 500 MG PO TABS: 500.0000 mg | ORAL_TABLET | Freq: Two times a day (BID) | ORAL | Status: DC

## 2012-08-18 MED ORDER — CLONIDINE HCL 0.3 MG PO TABS: 0.3000 mg | ORAL_TABLET | Freq: Two times a day (BID) | ORAL | Status: DC

## 2012-08-18 MED ORDER — SIMVASTATIN 40 MG PO TABS: 40.0000 mg | ORAL_TABLET | Freq: Every day | ORAL | Status: DC

## 2012-08-18 MED ORDER — TAMSULOSIN HCL 0.4 MG PO CAPS: 0.4000 mg | ORAL_CAPSULE | Freq: Every day | ORAL | Status: DC

## 2012-08-25 ENCOUNTER — Telehealth: Payer: Self-pay | Admitting: Oncology

## 2012-08-25 ENCOUNTER — Encounter: Payer: Self-pay | Admitting: Nurse Practitioner

## 2012-08-25 ENCOUNTER — Ambulatory Visit (INDEPENDENT_AMBULATORY_CARE_PROVIDER_SITE_OTHER): Payer: Medicare Other | Admitting: Nurse Practitioner

## 2012-08-25 VITALS — BP 133/58 | HR 47 | Temp 97.0°F | Ht 67.0 in | Wt 182.0 lb

## 2012-08-25 DIAGNOSIS — E119 Type 2 diabetes mellitus without complications: Secondary | ICD-10-CM

## 2012-08-25 DIAGNOSIS — D649 Anemia, unspecified: Secondary | ICD-10-CM

## 2012-08-25 DIAGNOSIS — I10 Essential (primary) hypertension: Secondary | ICD-10-CM

## 2012-08-25 DIAGNOSIS — E785 Hyperlipidemia, unspecified: Secondary | ICD-10-CM

## 2012-08-25 LAB — POCT CBC
Granulocyte percent: 50 %G (ref 37–80)
Lymph, poc: 1 (ref 0.6–3.4)
MPV: 7.5 fL (ref 0–99.8)
POC LYMPH PERCENT: 37.6 %L (ref 10–50)
Platelet Count, POC: 222 10*3/uL (ref 142–424)

## 2012-08-25 LAB — COMPLETE METABOLIC PANEL WITH GFR
ALT: 15 U/L (ref 0–53)
AST: 32 U/L (ref 0–37)
Albumin: 3.9 g/dL (ref 3.5–5.2)
Calcium: 9.9 mg/dL (ref 8.4–10.5)
Chloride: 110 mEq/L (ref 96–112)
Potassium: 4.9 mEq/L (ref 3.5–5.3)
Sodium: 145 mEq/L (ref 135–145)

## 2012-08-25 NOTE — Patient Instructions (Addendum)
Health Maintenance, Males A healthy lifestyle and preventative care can promote health and wellness.  Maintain regular health, dental, and eye exams.  Eat a healthy diet. Foods like vegetables, fruits, whole grains, low-fat dairy products, and lean protein foods contain the nutrients you need without too many calories. Decrease your intake of foods high in solid fats, added sugars, and salt. Get information about a proper diet from your caregiver, if necessary.  Regular physical exercise is one of the most important things you can do for your health. Most adults should get at least 150 minutes of moderate-intensity exercise (any activity that increases your heart rate and causes you to sweat) each week. In addition, most adults need muscle-strengthening exercises on 2 or more days a week.   Maintain a healthy weight. The body mass index (BMI) is a screening tool to identify possible weight problems. It provides an estimate of body fat based on height and weight. Your caregiver can help determine your BMI, and can help you achieve or maintain a healthy weight. For adults 20 years and older:  A BMI below 18.5 is considered underweight.  A BMI of 18.5 to 24.9 is normal.  A BMI of 25 to 29.9 is considered overweight.  A BMI of 30 and above is considered obese.  Maintain normal blood lipids and cholesterol by exercising and minimizing your intake of saturated fat. Eat a balanced diet with plenty of fruits and vegetables. Blood tests for lipids and cholesterol should begin at age 20 and be repeated every 5 years. If your lipid or cholesterol levels are high, you are over 50, or you are a high risk for heart disease, you may need your cholesterol levels checked more frequently.Ongoing high lipid and cholesterol levels should be treated with medicines, if diet and exercise are not effective.  If you smoke, find out from your caregiver how to quit. If you do not use tobacco, do not start.  If you  choose to drink alcohol, do not exceed 2 drinks per day. One drink is considered to be 12 ounces (355 mL) of beer, 5 ounces (148 mL) of wine, or 1.5 ounces (44 mL) of liquor.  Avoid use of street drugs. Do not share needles with anyone. Ask for help if you need support or instructions about stopping the use of drugs.  High blood pressure causes heart disease and increases the risk of stroke. Blood pressure should be checked at least every 1 to 2 years. Ongoing high blood pressure should be treated with medicines if weight loss and exercise are not effective.  If you are 45 to 77 years old, ask your caregiver if you should take aspirin to prevent heart disease.  Diabetes screening involves taking a blood sample to check your fasting blood sugar level. This should be done once every 3 years, after age 45, if you are within normal weight and without risk factors for diabetes. Testing should be considered at a younger age or be carried out more frequently if you are overweight and have at least 1 risk factor for diabetes.  Colorectal cancer can be detected and often prevented. Most routine colorectal cancer screening begins at the age of 50 and continues through age 75. However, your caregiver may recommend screening at an earlier age if you have risk factors for colon cancer. On a yearly basis, your caregiver may provide home test kits to check for hidden blood in the stool. Use of a small camera at the end of a tube,   to directly examine the colon (sigmoidoscopy or colonoscopy), can detect the earliest forms of colorectal cancer. Talk to your caregiver about this at age 50, when routine screening begins. Direct examination of the colon should be repeated every 5 to 10 years through age 75, unless early forms of pre-cancerous polyps or small growths are found.  Hepatitis C blood testing is recommended for all people born from 1945 through 1965 and any individual with known risks for hepatitis C.  Healthy  men should no longer receive prostate-specific antigen (PSA) blood tests as part of routine cancer screening. Consult with your caregiver about prostate cancer screening.  Testicular cancer screening is not recommended for adolescents or adult males who have no symptoms. Screening includes self-exam, caregiver exam, and other screening tests. Consult with your caregiver about any symptoms you have or any concerns you have about testicular cancer.  Practice safe sex. Use condoms and avoid high-risk sexual practices to reduce the spread of sexually transmitted infections (STIs).  Use sunscreen with a sun protection factor (SPF) of 30 or greater. Apply sunscreen liberally and repeatedly throughout the day. You should seek shade when your shadow is shorter than you. Protect yourself by wearing long sleeves, pants, a wide-brimmed hat, and sunglasses year round, whenever you are outdoors.  Notify your caregiver of new moles or changes in moles, especially if there is a change in shape or color. Also notify your caregiver if a mole is larger than the size of a pencil eraser.  A one-time screening for abdominal aortic aneurysm (AAA) and surgical repair of large AAAs by sound wave imaging (ultrasonography) is recommended for ages 65 to 75 years who are current or former smokers.  Stay current with your immunizations. Document Released: 10/27/2007 Document Revised: 07/23/2011 Document Reviewed: 09/25/2010 [employer] Patient Information 2013 [employer].  

## 2012-08-25 NOTE — Progress Notes (Signed)
Subjective:    Patient ID: Collin Campbell, male    DOB: 1925-11-08, 79 y.o.   MRN: [id number]  HPI Pt is here today for a routine follow-up. He is currently taking all medications as Rx.  HTN Monitors BP at home periodically. Runs 120-130/50-70.  DM Type II Monitors BS at home every morning and is running 75-100. Does not monitor diet or exercise but does quite a bit of yard work daily. Denies numbness or tingling in extremities. GERD Pt feels medication is helping with heartburn. Has not had any heartburn lately. Hyperlipidemia Denies leg or muscle pain.  COPD Taking Symbicurt daily and only has to use Albuterol 1-2x/week. BPH No problem voiding, may have dribbling occasionally.      Review of Systems  Constitutional: Negative for fatigue.  Respiratory: Negative.  Negative for shortness of breath.   Cardiovascular: Negative for chest pain.  Neurological: Negative for dizziness, weakness, numbness and headaches.  All other systems reviewed and are negative.       Objective:   Physical Exam  Constitutional: He is oriented to person, place, and time. He appears well-developed and well-nourished.  HENT:  Head: Normocephalic.  Right Ear: External ear normal.  Left Ear: External ear normal.  Nose: Nose normal.  Mouth/Throat: Oropharynx is clear and moist.  Eyes: EOM are normal. Pupils are equal, round, and reactive to light.  Neck: Normal range of motion. Neck supple. No thyromegaly present.  Cardiovascular: Normal rate, regular rhythm, normal heart sounds and intact distal pulses.   No murmur heard. Pulmonary/Chest: Effort normal and breath sounds normal. He has no wheezes. He has no rales.  Abdominal: Soft. Bowel sounds are normal.  Genitourinary: Prostate normal and penis normal.  Musculoskeletal: Normal range of motion.  Neurological: He is alert and oriented to person, place, and time.  Skin: Skin is warm and dry.  +4/4 monofilament bil, pedal pulse 2+ bil    Psychiatric: He has a normal mood and affect. His behavior is normal. Judgment and thought content normal.   BP 133/58  Pulse 47  Temp(Src) 97 F (36.1 C) (Oral)  Ht 5\' 7"  (1.702 m)  Wt 182 lb (82.555 kg)  BMI 28.5 kg/m2  Results for orders placed in visit on 08/25/12  POCT CBC      Result Value Range   WBC 2.6 (*) 4.6 - 10.2 K/uL   Lymph, poc 1.0  0.6 - 3.4   POC LYMPH PERCENT 37.6  10 - 50 %L   POC Granulocyte 1.3 (*) 2 - 6.9   Granulocyte percent 50.0  37 - 80 %G   RBC    4.69 - 6.13 M/uL   Hemoglobin 10.7 (*) 14.1 - 18.1 g/dL   HCT, POC 78.2 (*) 95.6 - 53.7 %   MCV 97.6 (*) 80 - 97 fL   MCH, POC 32.6 (*) 27 - 31.2 pg   MCHC 33.4  31.8 - 35.4 g/dL   RDW, POC 21.3     Platelet Count, POC 222.0  142 - 424 K/uL   MPV 7.5  0 - 99.8 fL  POCT GLYCOSYLATED HEMOGLOBIN (HGB A1C)      Result Value Range   Hemoglobin A1C 5.7             Assessment & Plan:  1. DM Low carb diet Exercise Continue monitoring BS daily - COMPLETE METABOLIC PANEL WITH GFR - POCT glycosylated hemoglobin (Hb A1C) - NMR Lipoprofile with Lipids  2. Hypertension Low Na+ diet Exercise - COMPLETE  METABOLIC PANEL WITH GFR - NMR Lipoprofile with Lipids  3. Hyperlipidemia Low fat diet - COMPLETE METABOLIC PANEL WITH GFR - NMR Lipoprofile with Lipids  4. Anemia Monitor for signs of bleeding in urine and stool - POCT CBC - Ambulatory referral to Hematology   Mary-Margaret Daphine Deutscher, FNP

## 2012-08-25 NOTE — Telephone Encounter (Signed)
S/W PT IN RE NP APPT 05/05 @ 3 W/DR. HA.  REFERRING - Paulene Floor, NP DX- ANEMIA WELCOME PACKET MAILED.

## 2012-08-25 NOTE — Telephone Encounter (Signed)
LVOM FOR PT TO RETURN CALL.  °

## 2012-08-27 ENCOUNTER — Telehealth: Payer: Self-pay | Admitting: Oncology

## 2012-08-27 LAB — NMR LIPOPROFILE WITH LIPIDS
Cholesterol, Total: 138 mg/dL (ref <200)
Large HDL-P: 4.8 umol/L (ref >=4.8)
Large VLDL-P: 2.4 nmol/L (ref <=2.7)
Triglycerides: 89 mg/dL (ref <150)
VLDL Size: 49.5 nm — ABNORMAL HIGH (ref <=46.6)

## 2012-08-27 NOTE — Telephone Encounter (Signed)
C/D 08/27/12 for appt. 09/15/12

## 2012-09-12 DIAGNOSIS — D649 Anemia, unspecified: Secondary | ICD-10-CM | POA: Insufficient documentation

## 2012-09-15 ENCOUNTER — Encounter: Payer: Self-pay | Admitting: Oncology

## 2012-09-15 ENCOUNTER — Ambulatory Visit (HOSPITAL_BASED_OUTPATIENT_CLINIC_OR_DEPARTMENT_OTHER): Payer: Medicare Other

## 2012-09-15 ENCOUNTER — Telehealth: Payer: Self-pay | Admitting: Oncology

## 2012-09-15 ENCOUNTER — Other Ambulatory Visit (HOSPITAL_BASED_OUTPATIENT_CLINIC_OR_DEPARTMENT_OTHER): Payer: Medicare Other | Admitting: Lab

## 2012-09-15 ENCOUNTER — Ambulatory Visit (HOSPITAL_BASED_OUTPATIENT_CLINIC_OR_DEPARTMENT_OTHER): Payer: Medicare Other | Admitting: Oncology

## 2012-09-15 VITALS — BP 170/78 | HR 50 | Temp 98.3°F | Resp 18 | Ht 67.0 in | Wt 183.0 lb

## 2012-09-15 DIAGNOSIS — D72819 Decreased white blood cell count, unspecified: Secondary | ICD-10-CM

## 2012-09-15 DIAGNOSIS — N189 Chronic kidney disease, unspecified: Secondary | ICD-10-CM

## 2012-09-15 DIAGNOSIS — D539 Nutritional anemia, unspecified: Secondary | ICD-10-CM | POA: Insufficient documentation

## 2012-09-15 DIAGNOSIS — D649 Anemia, unspecified: Secondary | ICD-10-CM

## 2012-09-15 LAB — CBC WITH DIFFERENTIAL/PLATELET
Basophils Absolute: 0 10*3/uL (ref 0.0–0.1)
Eosinophils Absolute: 0.1 10*3/uL (ref 0.0–0.5)
HGB: 10.6 g/dL — ABNORMAL LOW (ref 13.0–17.1)
MCV: 98.6 fL — ABNORMAL HIGH (ref 79.3–98.0)
MONO#: 0.5 10*3/uL (ref 0.1–0.9)
MONO%: 16.2 % — ABNORMAL HIGH (ref 0.0–14.0)
NEUT#: 1.4 10*3/uL — ABNORMAL LOW (ref 1.5–6.5)
Platelets: 194 10*3/uL (ref 140–400)
RDW: 13.8 % (ref 11.0–14.6)
WBC: 2.8 10*3/uL — ABNORMAL LOW (ref 4.0–10.3)

## 2012-09-15 LAB — MORPHOLOGY: PLT EST: ADEQUATE

## 2012-09-15 LAB — CHCC SMEAR

## 2012-09-15 NOTE — Progress Notes (Signed)
Checked in new patient. No financial issues. °

## 2012-09-15 NOTE — Patient Instructions (Addendum)
1.  Issues:  Anemia. 2.  Potential causes:  Chronic kidney disease.  Cannot rule out myelodysplastic syndrome (MDS). 3.  Recommendation:  *  I sent blood work today to rule out myeloma.   *  Follow blood count every 3 months.  In the future, if significant anemia or also low white blood cell and platelet, we may consider bone marrow biopsy to rule out MDS.  Untreated MDS can progress to acute leukemia.  There are different levels of MDS.  Only intermediate-high risk MDS needs to be treated with chemo.  Your blood count level is not severe enough; thus, it is less likely for you to have intermediate-high risk MDS at this time.  *  However, if you prefer bone marrow biopsy now, it is not wrong to do so.   4.  Return visit in about 6 months.

## 2012-09-15 NOTE — Progress Notes (Signed)
[facility]  [phone] [phone]     INITIAL HEMATOLOGY CONSULTATION    Referral MD:  Collin Penna, MD.   Reason for Referral: anemia.     HPI:  [name] is an 79 year-old man with hx of DM, HTN, CKD.  He has had anemia for at least 3 years per [facility].  He has negative colonoscopy and EGD within the last 3 years per [facility].  He has been having persistent anemia. Therefore, he was kindly referred to the [facility] for evaluation.  [name] presented to the clinic for the first time today with his daughter.  He reported that he has left shoulder pain due to taking down an apple tree a few weeks ago.  He denied bone pain elsewhere.  He denied visible source of bleeding.  Patient denies fever, anorexia, weight loss, fatigue, headache, visual changes, confusion, drenching night sweats, palpable lymph node swelling, mucositis, odynophagia, dysphagia, nausea vomiting, jaundice, chest pain, palpitation, shortness of breath, dyspnea on exertion, productive cough, gum bleeding, epistaxis, hematemesis, hemoptysis, abdominal pain, abdominal swelling, early satiety, melena, hematochezia, hematuria, skin rash, spontaneous bleeding, joint swelling, heat or cold intolerance, bowel bladder incontinence, back pain, focal motor weakness, paresthesia, depression.      Past Medical History  Diagnosis Date  . Hypercholesterolemia   . Dysrhythmia     "irregular after surgery"  . Diabetes mellitus   . COPD (chronic obstructive pulmonary disease)   . Shortness of breath     with exertion  . Asthma   . Pneumonia     x 2 years ago  . GERD (gastroesophageal reflux disease)   . H/O hiatal hernia   . Headache     migraines years ago  . Arthritis   . Anemia   . Glaucoma   . Peptic ulcer disease   . Chronic kidney disease 08/14/11    OV  Dr Collin Campbell with recommendations on chart/ BPH  . Hypertension     EKG 10/18/11, chest 6/13 on chart  .  Sleep apnea 02-15-12    STOP BANG SCORE 4  . CAD (coronary artery disease) 1999    PCI with stent  . BPH (benign prostatic hyperplasia)   . Unspecified deficiency anemia   :    Past Surgical History  Procedure Laterality Date  . Transurethral resection of prostate    . Nasal polyp surgery    . Eye surgery      bilateral cataract extraction with IOL  . Coronary angioplasty  12/06/1999    x1 mid LAD/ no current cardiologist  . Total knee arthroplasty  10/30/2011    Procedure: TOTAL KNEE ARTHROPLASTY;  Surgeon: Collin Pal, MD;  Location: [facility];  Service: Orthopedics;  Laterality: Right;  . Total knee arthroplasty  02/19/2012    Procedure: TOTAL KNEE ARTHROPLASTY;  Surgeon: Collin Pal, MD;  Location: [facility];  Service: Orthopedics;  Laterality: Left;  . Colonoscopy  2012    neg  . Esophagogastroduodenoscopy  2011    neg  :   CURRENT MEDS: Current Outpatient Prescriptions  Medication Sig Dispense Refill  . ACCU-CHEK AVIVA PLUS test strip       . albuterol (PROVENTIL HFA;VENTOLIN HFA) 108 (90 BASE) MCG/ACT inhaler Inhale 2 puffs into the lungs every 6 (six) hours as needed.      Marland Kitchen aspirin 325 MG tablet Take 325 mg by mouth daily.      . budesonide-formoterol (SYMBICORT) 80-4.5  MCG/ACT inhaler Inhale 1 puff into the lungs 2 (two) times daily.       . castor oil liquid Take 30 mLs by mouth daily as needed. For constipation      . Cholecalciferol (VITAMIN D3) 2000 UNITS TABS Take 1 tablet by mouth daily.       . cloNIDine (CATAPRES) 0.3 MG tablet Take 1 tablet (0.3 mg total) by mouth 2 (two) times daily.  60 tablet  0  . docusate sodium (COLACE) 100 MG capsule Take 100 mg by mouth 2 (two) times daily.      . fenofibrate 160 MG tablet Take 1 tablet (160 mg total) by mouth daily with breakfast.  30 tablet  0  . ferrous sulfate 325 (65 FE) MG tablet Take 1 tablet (325 mg total) by mouth 3 (three) times daily after meals.      Marland Kitchen HYDROcodone-acetaminophen (NORCO) 7.5-325 MG per tablet  Take 1-2 tablets by mouth every 4 (four) hours as needed for pain.  120 tablet  0  . loratadine (CLARITIN) 10 MG tablet Take 10 mg by mouth at bedtime.      . metFORMIN (GLUCOPHAGE) 500 MG tablet Take 1 tablet (500 mg total) by mouth 2 (two) times daily with a meal.  60 tablet  0  . methocarbamol (ROBAXIN) 500 MG tablet Take 1 tablet (500 mg total) by mouth every 6 (six) hours.  50 tablet  0  . Multiple Vitamin (MULTIVITAMIN WITH MINERALS) TABS Take 0.5 tablets by mouth 2 (two) times daily.      Marland Kitchen NIFEdipine (PROCARDIA XL/ADALAT-CC) 30 MG 24 hr tablet Take 30 mg by mouth daily with breakfast.      . nitroGLYCERIN (NITROSTAT) 0.4 MG SL tablet Place 0.4 mg under the tongue every 5 (five) minutes as needed. For chest pain      . pantoprazole (PROTONIX) 40 MG tablet Take 40 mg by mouth daily with breakfast.       . simvastatin (ZOCOR) 40 MG tablet Take 1 tablet (40 mg total) by mouth at bedtime.  30 tablet  0  . tamsulosin (FLOMAX) 0.4 MG CAPS Take 1 capsule (0.4 mg total) by mouth daily after supper.  30 capsule  0  . Zinc 50 MG CAPS Take 1 capsule by mouth daily with breakfast.      . losartan-hydrochlorothiazide (HYZAAR) 100-25 MG per tablet Take 1 tablet by mouth daily with breakfast.        No current facility-administered medications for this visit.      Allergies  Allergen Reactions  . Sulfonamide Derivatives Swelling and Rash    Feet feels like like on needles   :  Family History  Problem Relation Age of Onset  . Asthma Mother   . Hypertension Mother   . Anemia Mother   . Hypertension Father   . Stroke Father   . Cancer Brother     colon, head and neck, prostate  :  History   Social History  . Marital Status: Married    Spouse Name: N/A    Number of Children: 5  . Years of Education: N/A   Occupational History  .      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    Social History Main Topics  . Smoking status: Former Smoker -- 2.00 packs/day    Types: Cigarettes      Quit date: 10/24/1970  . Smokeless tobacco: Never Used  . Alcohol Use: Yes     Comment: 2-3 shots vodka  nightly  . Drug Use: No  . Sexually Active: Not Currently   Other Topics Concern  . Not on file   Social History Narrative  . No narrative on file  :  REVIEW OF SYSTEM:  The rest of the 14-point review of sytem was negative.   Exam: ECOG 0-1  General:  well-nourished man in no acute distress.  Eyes:  no scleral icterus.  ENT:  There were no oropharyngeal lesions.  Neck was without thyromegaly.  Lymphatics:  Negative cervical, supraclavicular or axillary adenopathy.  Respiratory: lungs were clear bilaterally without wheezing or crackles.  Cardiovascular:  Regular rate and rhythm, S1/S2, without murmur, rub or gallop.  There was no pedal edema.  GI:  abdomen was soft, flat, nontender, nondistended, without organomegaly.  Muscoloskeletal:  no spinal tenderness of palpation of vertebral spine.  There was no increased warmth, swelling, pain on palpation of left shoulder.  When he raised it above his head, he had pain in the left shoulder.  Skin exam was without echymosis, petichae.  Neuro exam was nonfocal.  Patient was able to get on and off exam table without assistance.  Gait was normal.  Patient was alerted and oriented.  Attention was good.   Language was appropriate.  Mood was normal without depression.  Speech was not pressured.  Thought content was not tangential.    LABS:  Lab Results  Component Value Date   WBC 2.8* 09/15/2012   HGB 10.6* 09/15/2012   HCT 31.8* 09/15/2012   PLT 194 09/15/2012   GLUCOSE 107* 08/25/2012   TRIG 89 08/25/2012   LDLCALC 64 08/25/2012   ALT 15 08/25/2012   AST 32 08/25/2012   NA 145 08/25/2012   K 4.9 08/25/2012   CL 110 08/25/2012   CREATININE 1.56* 08/25/2012   BUN 16 08/25/2012   CO2 24 08/25/2012   INR 1.13 02/15/2012   HGBA1C 5.7 08/25/2012     Blood smear review:   I personally reviewed the patient's peripheral blood smear today.  There was  isocytosis.  There was no peripheral blast.  There was no schistocytosis, spherocytosis, target cell, rouleaux formation, tear drop cell.  There was no giant platelets or platelet clumps.      ASSESSMENT AND PLAN:   1.  Issues:  Anemia. 2.  Potential causes:  Most likely due to \\chronic  kidney disease.  Cannot rule out myelodysplastic syndrome (MDS). 3.  Recommendation:  *  I sent blood work today to rule out myeloma.   *  Follow blood count in about 3 months.  In the future, if significant anemia or also low white blood cell and platelet, we may consider bone marrow biopsy to rule out MDS.  Untreated MDS can progress to acute leukemia.  There are different levels of MDS.  Only intermediate-high risk MDS needs to be treated with chemo.  His blood count level is not severe lowenough; thus, it is less likely for he has intermediate-high risk MDS at this time.  *  However, if he prefers bone marrow biopsy now, it is not wrong to do so.  He and his daughter preferred to observe for now.   4.  Lab only appointment in about 3 months here at the [facility].  Return visit in about 6 months.   I informed Mr. Ureta that I am leaving the practice.  [facility] will arrange for him to see another provider when he returns in about 6 months.    The length  of time of the face-to-face encounter was 30 minutes. More than 50% of time was spent counseling and coordination of care.     Thank you for this referral.

## 2012-09-16 ENCOUNTER — Other Ambulatory Visit: Payer: Self-pay | Admitting: Nurse Practitioner

## 2012-09-16 ENCOUNTER — Encounter: Payer: Self-pay | Admitting: Oncology

## 2012-09-17 ENCOUNTER — Other Ambulatory Visit: Payer: Self-pay | Admitting: Nurse Practitioner

## 2012-09-17 LAB — IRON AND TIBC
%SAT: 36 % (ref 20–55)
Iron: 115 ug/dL (ref 42–165)

## 2012-09-17 LAB — PROTEIN ELECTROPHORESIS, SERUM
Albumin ELP: 62.2 % (ref 55.8–66.1)
Alpha-1-Globulin: 5.6 % — ABNORMAL HIGH (ref 2.9–4.9)
Alpha-2-Globulin: 9.8 % (ref 7.1–11.8)
Beta 2: 3.9 % (ref 3.2–6.5)
Beta Globulin: 7.1 % (ref 4.7–7.2)
Gamma Globulin: 11.4 % (ref 11.1–18.8)

## 2012-09-17 LAB — KAPPA/LAMBDA LIGHT CHAINS: Kappa:Lambda Ratio: 1.38 (ref 0.26–1.65)

## 2012-09-17 LAB — FERRITIN: Ferritin: 170 ng/mL (ref 22–322)

## 2012-09-17 NOTE — Telephone Encounter (Signed)
LAST OV 1/14 

## 2012-09-18 ENCOUNTER — Other Ambulatory Visit: Payer: Self-pay | Admitting: *Deleted

## 2012-09-18 MED ORDER — ALBUTEROL SULFATE HFA 108 (90 BASE) MCG/ACT IN AERS: INHALATION_SPRAY | RESPIRATORY_TRACT | Status: DC

## 2012-09-29 ENCOUNTER — Ambulatory Visit (INDEPENDENT_AMBULATORY_CARE_PROVIDER_SITE_OTHER): Payer: Medicare Other | Admitting: Family Medicine

## 2012-09-29 DIAGNOSIS — E538 Deficiency of other specified B group vitamins: Secondary | ICD-10-CM

## 2012-09-29 MED ORDER — CYANOCOBALAMIN 1000 MCG/ML IJ SOLN
1000.0000 ug | INTRAMUSCULAR | Status: AC
  Administered 2012-09-29: 1000 ug via INTRAMUSCULAR

## 2012-10-08 ENCOUNTER — Encounter: Payer: Self-pay | Admitting: Nurse Practitioner

## 2012-10-08 ENCOUNTER — Ambulatory Visit (INDEPENDENT_AMBULATORY_CARE_PROVIDER_SITE_OTHER): Payer: Medicare Other | Admitting: Nurse Practitioner

## 2012-10-08 VITALS — BP 159/84 | HR 60 | Temp 98.6°F | Ht 65.75 in | Wt 183.0 lb

## 2012-10-08 DIAGNOSIS — D649 Anemia, unspecified: Secondary | ICD-10-CM

## 2012-10-08 DIAGNOSIS — E538 Deficiency of other specified B group vitamins: Secondary | ICD-10-CM

## 2012-10-08 DIAGNOSIS — I10 Essential (primary) hypertension: Secondary | ICD-10-CM

## 2012-10-08 DIAGNOSIS — E119 Type 2 diabetes mellitus without complications: Secondary | ICD-10-CM

## 2012-10-08 DIAGNOSIS — E785 Hyperlipidemia, unspecified: Secondary | ICD-10-CM

## 2012-10-08 MED ORDER — CYANOCOBALAMIN 1000 MCG/ML IJ SOLN: 1000.0000 ug | INTRAMUSCULAR | Status: AC

## 2012-10-08 NOTE — Progress Notes (Signed)
Subjective:    Patient ID: Collin Campbell, male    DOB: 02-16-1926, 79 y.o.   MRN: [id number]  Hypertension This is a chronic problem. The current episode started more than 1 year ago. The problem is unchanged. The problem is uncontrolled. Pertinent negatives include no blurred vision, chest pain, malaise/fatigue, palpitations or peripheral edema. Risk factors for coronary artery disease include diabetes mellitus, dyslipidemia, family history and male gender. Past treatments include diuretics. The current treatment provides mild improvement. Compliance problems include diet.  There is no history of a thyroid problem. Identifiable causes of hypertension include sleep apnea.  Diabetes He has type 2 diabetes mellitus. His disease course has been stable. Pertinent negatives for hypoglycemia include no dizziness, seizures or speech difficulty. Associated symptoms include visual change. Pertinent negatives for diabetes include no blurred vision, no chest pain and no foot ulcerations. There are no hypoglycemic complications. Symptoms are stable. Risk factors for coronary artery disease include diabetes mellitus, dyslipidemia, family history and male sex. Current diabetic treatment includes oral agent (monotherapy). He is compliant with treatment most of the time. His weight is stable. He is following a generally healthy diet. He participates in exercise daily. There is no change in his home blood glucose trend. His breakfast blood glucose is taken between 8-9 am. His breakfast blood glucose range is generally 90-110 mg/dl. He does not see a podiatrist.Eye exam is not current (Pt states been "years" at least two or three).  Hyperlipidemia This is a chronic problem. The current episode started more than 1 year ago. The problem is controlled. Recent lipid tests were reviewed and are normal. Exacerbating diseases include diabetes. He has no history of hypothyroidism. Pertinent negatives include no chest pain.  Current antihyperlipidemic treatment includes exercise and statins. The current treatment provides mild improvement of lipids. There are no compliance problems.  Risk factors for coronary artery disease include diabetes mellitus, dyslipidemia, family history, male sex and hypertension.  B12 deficiency Patient wants to give himself shots at home- Needs rx   Review of Systems  Constitutional: Negative for malaise/fatigue.  Eyes: Negative for blurred vision.  Respiratory: Positive for cough.   Cardiovascular: Negative for chest pain and palpitations.  Neurological: Negative for dizziness, seizures and speech difficulty.  All other systems reviewed and are negative.       Objective:   Physical Exam  Constitutional: He is oriented to person, place, and time. He appears well-developed and well-nourished.  HENT:  Head: Normocephalic.  Eyes: Pupils are equal, round, and reactive to light.  Neck: Normal range of motion. No JVD present. No thyromegaly present.  Cardiovascular: Normal rate, regular rhythm and normal heart sounds.   Pulmonary/Chest: Effort normal.  Diminished breath sounds   Abdominal: Soft. Bowel sounds are normal. He exhibits no mass. There is no tenderness. There is no rebound.  Musculoskeletal: Normal range of motion. He exhibits edema (trace amount on RL ankle).  Neurological: He is alert and oriented to person, place, and time.  Skin: Skin is warm and dry.    BP 159/84  Pulse 60  Temp(Src) 98.6 F (37 C) (Oral)  Ht 5' 5.75" (1.67 m)  Wt 183 lb (83.008 kg)  BMI 29.76 kg/m2      Assessment & Plan:   1. Vitamin B12 deficiency   2. Type II or unspecified type diabetes mellitus without mention of complication, not stated as uncontrolled   3. Hypertension   4. Hyperlipidemia   5. Anemia      Medication List  These changes are accurate as of: 10/08/2012 12:30 PM. If you have any questions, ask your nurse or doctor.          STOP taking these  medications       methocarbamol 500 MG tablet  Commonly known as:  ROBAXIN  Stopped by:  Bennie Pierini, FNP      TAKE these medications       ACCU-CHEK AVIVA PLUS test strip  Generic drug:  glucose blood     albuterol 108 (90 BASE) MCG/ACT inhaler  Commonly known as:  PROAIR HFA  INHALE 2 PUFFS EVERY 4 HOURS AS NEEDED FOR WHEEZING     aspirin 325 MG tablet  Take 325 mg by mouth daily.     budesonide-formoterol 80-4.5 MCG/ACT inhaler  Commonly known as:  SYMBICORT  Inhale 1 puff into the lungs 2 (two) times daily.     castor oil liquid  Take 30 mLs by mouth daily as needed. For constipation     cloNIDine 0.3 MG tablet  Commonly known as:  CATAPRES  TAKE 1 TABLET BY MOUTH TWICE DAILY.     cyanocobalamin 1000 MCG/ML injection  Commonly known as:  (VITAMIN B-12)  Inject 1 mL (1,000 mcg total) into the muscle every 30 (thirty) days.  Started by:  Bennie Pierini, FNP     docusate sodium 100 MG capsule  Commonly known as:  COLACE  Take 100 mg by mouth 2 (two) times daily.     fenofibrate 160 MG tablet  TAKE ONE TABLET BY MOUTH ONCE DAILY.     ferrous sulfate 325 (65 FE) MG tablet  Take 1 tablet (325 mg total) by mouth 3 (three) times daily after meals.     HYDROcodone-acetaminophen 7.5-325 MG per tablet  Commonly known as:  NORCO  Take 1-2 tablets by mouth every 4 (four) hours as needed for pain.     loratadine 10 MG tablet  Commonly known as:  CLARITIN  Take 10 mg by mouth at bedtime.     losartan-hydrochlorothiazide 100-25 MG per tablet  Commonly known as:  HYZAAR  Take 1 tablet by mouth daily with breakfast.     metFORMIN 500 MG tablet  Commonly known as:  GLUCOPHAGE  TAKE 1 TABLET BY MOUTH TWICE DAILY.     multivitamin with minerals Tabs  Take 0.5 tablets by mouth 2 (two) times daily.     NIFEdipine 30 MG 24 hr tablet  Commonly known as:  PROCARDIA-XL/ADALAT-CC/NIFEDICAL-XL  Take 30 mg by mouth daily with breakfast.     nitroGLYCERIN 0.4  MG SL tablet  Commonly known as:  NITROSTAT  Place 0.4 mg under the tongue every 5 (five) minutes as needed. For chest pain     pantoprazole 40 MG tablet  Commonly known as:  PROTONIX  Take 40 mg by mouth daily with breakfast.     simvastatin 40 MG tablet  Commonly known as:  ZOCOR  TAKE 1 TABLET BY MOUTH AT BEDTIME FOR CHOLESTEROL.     tamsulosin 0.4 MG Caps  Commonly known as:  FLOMAX  TAKE 1 CAPSULE BY MOUTH ONCE DAILY.     Vitamin D3 2000 UNITS Tabs  Take 1 tablet by mouth daily.     Zinc 50 MG Caps  Take 1 capsule by mouth daily with breakfast.       Continue all meds Labs pending Diet and exercise encouraged Follow-up in 3 months  .mmmms

## 2012-10-08 NOTE — Patient Instructions (Addendum)

## 2012-10-08 NOTE — Progress Notes (Deleted)
  Subjective:    Patient ID: Collin Campbell, male    DOB: 21-Nov-1925, 79 y.o.   MRN: [id number]  HPI    Review of Systems     Objective:   Physical Exam        Assessment & Plan:

## 2012-11-20 ENCOUNTER — Other Ambulatory Visit: Payer: Self-pay

## 2012-11-20 MED ORDER — GLUCOSE BLOOD VI STRP: ORAL_STRIP | Status: DC

## 2012-11-20 MED ORDER — PANTOPRAZOLE SODIUM 40 MG PO TBEC: 40.0000 mg | DELAYED_RELEASE_TABLET | Freq: Every day | ORAL | Status: DC

## 2012-12-16 ENCOUNTER — Other Ambulatory Visit (HOSPITAL_BASED_OUTPATIENT_CLINIC_OR_DEPARTMENT_OTHER): Payer: Medicare Other | Admitting: Lab

## 2012-12-16 DIAGNOSIS — D649 Anemia, unspecified: Secondary | ICD-10-CM

## 2012-12-16 DIAGNOSIS — N189 Chronic kidney disease, unspecified: Secondary | ICD-10-CM

## 2012-12-16 DIAGNOSIS — D72819 Decreased white blood cell count, unspecified: Secondary | ICD-10-CM

## 2012-12-16 LAB — CBC WITH DIFFERENTIAL/PLATELET
Eosinophils Absolute: 0.1 10*3/uL (ref 0.0–0.5)
HCT: 29.6 % — ABNORMAL LOW (ref 38.4–49.9)
HGB: 9.9 g/dL — ABNORMAL LOW (ref 13.0–17.1)
LYMPH%: 33.6 % (ref 14.0–49.0)
MONO#: 0.5 10*3/uL (ref 0.1–0.9)
NEUT#: 1.4 10*3/uL — ABNORMAL LOW (ref 1.5–6.5)
NEUT%: 47.2 % (ref 39.0–75.0)
Platelets: 207 10*3/uL (ref 140–400)
WBC: 3.1 10*3/uL — ABNORMAL LOW (ref 4.0–10.3)
lymph#: 1 10*3/uL (ref 0.9–3.3)

## 2012-12-18 ENCOUNTER — Other Ambulatory Visit: Payer: Self-pay | Admitting: *Deleted

## 2012-12-18 MED ORDER — ALBUTEROL SULFATE HFA 108 (90 BASE) MCG/ACT IN AERS: INHALATION_SPRAY | RESPIRATORY_TRACT | Status: DC

## 2012-12-18 MED ORDER — CLONIDINE HCL 0.3 MG PO TABS: ORAL_TABLET | ORAL | Status: DC

## 2012-12-18 MED ORDER — METFORMIN HCL 500 MG PO TABS: ORAL_TABLET | ORAL | Status: DC

## 2013-02-19 ENCOUNTER — Other Ambulatory Visit: Payer: Self-pay

## 2013-02-19 MED ORDER — NIFEDIPINE ER OSMOTIC RELEASE 30 MG PO TB24: 30.0000 mg | ORAL_TABLET | Freq: Every day | ORAL | Status: DC

## 2013-02-19 MED ORDER — LOSARTAN POTASSIUM-HCTZ 100-25 MG PO TABS: 1.0000 | ORAL_TABLET | Freq: Every day | ORAL | Status: DC

## 2013-03-11 ENCOUNTER — Telehealth: Payer: Self-pay | Admitting: Hematology and Oncology

## 2013-03-11 NOTE — Telephone Encounter (Signed)
sw pt to make aware of 11/19 appts Pt already knew per MyChart...shh

## 2013-03-18 ENCOUNTER — Ambulatory Visit: Payer: Medicare Other

## 2013-03-18 ENCOUNTER — Other Ambulatory Visit: Payer: Medicare Other | Admitting: Lab

## 2013-03-19 ENCOUNTER — Other Ambulatory Visit: Payer: Self-pay

## 2013-03-20 ENCOUNTER — Other Ambulatory Visit: Payer: Self-pay

## 2013-03-20 ENCOUNTER — Ambulatory Visit (INDEPENDENT_AMBULATORY_CARE_PROVIDER_SITE_OTHER): Payer: Medicare Other | Admitting: Nurse Practitioner

## 2013-03-20 ENCOUNTER — Encounter: Payer: Self-pay | Admitting: Nurse Practitioner

## 2013-03-20 VITALS — BP 160/72 | HR 45 | Temp 97.9°F | Ht 65.0 in | Wt 186.0 lb

## 2013-03-20 DIAGNOSIS — E785 Hyperlipidemia, unspecified: Secondary | ICD-10-CM

## 2013-03-20 DIAGNOSIS — Z23 Encounter for immunization: Secondary | ICD-10-CM

## 2013-03-20 DIAGNOSIS — I10 Essential (primary) hypertension: Secondary | ICD-10-CM

## 2013-03-20 DIAGNOSIS — E119 Type 2 diabetes mellitus without complications: Secondary | ICD-10-CM

## 2013-03-20 LAB — POCT UA - MICROALBUMIN: Microalbumin Ur, POC: NEGATIVE mg/L

## 2013-03-20 MED ORDER — PANTOPRAZOLE SODIUM 40 MG PO TBEC: 40.0000 mg | DELAYED_RELEASE_TABLET | Freq: Every day | ORAL | Status: DC

## 2013-03-20 MED ORDER — TAMSULOSIN HCL 0.4 MG PO CAPS: 0.4000 mg | ORAL_CAPSULE | Freq: Every day | ORAL | Status: DC

## 2013-03-20 MED ORDER — ALBUTEROL SULFATE HFA 108 (90 BASE) MCG/ACT IN AERS: INHALATION_SPRAY | RESPIRATORY_TRACT | Status: DC

## 2013-03-20 MED ORDER — METFORMIN HCL 500 MG PO TABS: ORAL_TABLET | ORAL | Status: DC

## 2013-03-20 NOTE — Patient Instructions (Signed)

## 2013-03-20 NOTE — Telephone Encounter (Signed)
Last seen 10/08/12 MMM Last lipid 4/14

## 2013-03-20 NOTE — Telephone Encounter (Signed)
Last seen 10/08/12  Last glucose 5/14  MMM

## 2013-03-20 NOTE — Telephone Encounter (Signed)
Last seen 10/08/12  MMM 

## 2013-03-20 NOTE — Progress Notes (Signed)
  Subjective:    Patient ID: Collin Campbell, male    DOB: 01/04/1926, 79 y.o.   MRN: [id number]  HPI  Pt is here today for a routine follow-up. He is currently taking all medications as Rx.  HTN Monitors BP at home periodically. Runs 120-130/50-70.  DM Type II Monitors BS at home every morning and is running 75-100. Does not monitor diet or exercise but does quite a bit of yard work daily. Denies numbness or tingling in extremities. GERD Pt feels medication is helping with heartburn. Has not had any heartburn lately. Hyperlipidemia Denies leg or muscle pain.  COPD Taking Symbicurt daily and only has to use Albuterol 1-2x/week. BPH No problem voiding, may have dribbling occasionally.      Review of Systems  Constitutional: Negative for fatigue.  Respiratory: Negative.  Negative for shortness of breath.   Cardiovascular: Negative for chest pain.  Neurological: Negative for dizziness, weakness, numbness and headaches.  All other systems reviewed and are negative.       Objective:   Physical Exam  Constitutional: He is oriented to person, place, and time. He appears well-developed and well-nourished.  HENT:  Head: Normocephalic.  Right Ear: External ear normal.  Left Ear: External ear normal.  Nose: Nose normal.  Mouth/Throat: Oropharynx is clear and moist.  Eyes: EOM are normal. Pupils are equal, round, and reactive to light.  Neck: Normal range of motion. Neck supple. No thyromegaly present.  Cardiovascular: Normal rate, regular rhythm, normal heart sounds and intact distal pulses.   No murmur heard. Pulmonary/Chest: Effort normal and breath sounds normal. He has no wheezes. He has no rales.  Abdominal: Soft. Bowel sounds are normal.  Musculoskeletal: Normal range of motion.  Neurological: He is alert and oriented to person, place, and time.  Skin: Skin is warm and dry.  +4/4 monofilament bil, pedal pulse 2+ bil   Psychiatric: He has a normal mood and affect. His  behavior is normal. Judgment and thought content normal.   BP 160/72  Pulse 45  Temp(Src) 97.9 F (36.6 C) (Oral)  Ht 5\' 5"  (1.651 m)  Wt 186 lb (84.369 kg)  BMI 30.95 kg/m2  Results for orders placed in visit on 03/20/13  POCT GLYCOSYLATED HEMOGLOBIN (HGB A1C)      Result Value Range   Hemoglobin A1C 6.0%          Assessment & Plan:   1. DM   2. Hypertension   3. Hyperlipidemia    Orders Placed This Encounter  Procedures  . CMP14+EGFR  . NMR, lipoprofile  . POCT glycosylated hemoglobin (Hb A1C)  . POCT UA - Microalbumin   Meds ordered this encounter  Medications  . methocarbamol (ROBAXIN) 500 MG tablet    Sig: Take 500 mg by mouth 4 (four) times daily.    Continue all meds Labs pending Diet and exercise encouraged Health maintenance reviewed Follow up in 3 months  Mary-Margaret Daphine Deutscher, FNP

## 2013-03-22 LAB — CMP14+EGFR
ALT: 14 IU/L (ref 0–44)
BUN/Creatinine Ratio: 10 (ref 10–22)
CO2: 24 mmol/L (ref 18–29)
Calcium: 9.9 mg/dL (ref 8.6–10.2)
Chloride: 101 mmol/L (ref 97–108)
Globulin, Total: 2.3 g/dL (ref 1.5–4.5)
Glucose: 83 mg/dL (ref 65–99)
Potassium: 4.8 mmol/L (ref 3.5–5.2)
Total Protein: 6.4 g/dL (ref 6.0–8.5)

## 2013-03-22 LAB — NMR, LIPOPROFILE
Cholesterol: 141 mg/dL (ref <200)
HDL Cholesterol by NMR: 61 mg/dL (ref >=40)
HDL Particle Number: 48 umol/L (ref >=30.5)
LDL Particle Number: 1450 nmol/L — ABNORMAL HIGH (ref <1000)
LDL Size: 20.6 nm (ref >20.5)
Triglycerides by NMR: 87 mg/dL (ref <150)

## 2013-03-31 ENCOUNTER — Other Ambulatory Visit: Payer: Self-pay | Admitting: Hematology and Oncology

## 2013-03-31 DIAGNOSIS — D649 Anemia, unspecified: Secondary | ICD-10-CM

## 2013-04-01 ENCOUNTER — Encounter: Payer: Self-pay | Admitting: Hematology and Oncology

## 2013-04-01 ENCOUNTER — Telehealth: Payer: Self-pay | Admitting: Hematology and Oncology

## 2013-04-01 ENCOUNTER — Ambulatory Visit (HOSPITAL_BASED_OUTPATIENT_CLINIC_OR_DEPARTMENT_OTHER): Payer: Medicare Other | Admitting: Hematology and Oncology

## 2013-04-01 ENCOUNTER — Other Ambulatory Visit (HOSPITAL_BASED_OUTPATIENT_CLINIC_OR_DEPARTMENT_OTHER): Payer: Medicare Other | Admitting: Lab

## 2013-04-01 ENCOUNTER — Telehealth: Payer: Self-pay | Admitting: *Deleted

## 2013-04-01 VITALS — BP 132/70 | HR 60 | Temp 97.7°F | Resp 18 | Ht 65.0 in | Wt 188.7 lb

## 2013-04-01 DIAGNOSIS — D649 Anemia, unspecified: Secondary | ICD-10-CM

## 2013-04-01 DIAGNOSIS — E538 Deficiency of other specified B group vitamins: Secondary | ICD-10-CM

## 2013-04-01 DIAGNOSIS — N189 Chronic kidney disease, unspecified: Secondary | ICD-10-CM

## 2013-04-01 DIAGNOSIS — D72819 Decreased white blood cell count, unspecified: Secondary | ICD-10-CM

## 2013-04-01 HISTORY — DX: Decreased white blood cell count, unspecified: D72.819

## 2013-04-01 LAB — CBC & DIFF AND RETIC
EOS%: 2.6 % (ref 0.0–7.0)
Eosinophils Absolute: 0.1 10*3/uL (ref 0.0–0.5)
HGB: 10.3 g/dL — ABNORMAL LOW (ref 13.0–17.1)
Immature Retic Fract: 8.4 % (ref 3.00–10.60)
LYMPH%: 29.2 % (ref 14.0–49.0)
MCH: 32.4 pg (ref 27.2–33.4)
MCHC: 32.2 g/dL (ref 32.0–36.0)
MCV: 100.6 fL — ABNORMAL HIGH (ref 79.3–98.0)
MONO%: 14.1 % — ABNORMAL HIGH (ref 0.0–14.0)
NEUT#: 1.6 10*3/uL (ref 1.5–6.5)
RBC: 3.18 10*6/uL — ABNORMAL LOW (ref 4.20–5.82)
RDW: 13.3 % (ref 11.0–14.6)
Retic %: 1.74 % (ref 0.80–1.80)
Retic Ct Abs: 55.33 10*3/uL (ref 34.80–93.90)
lymph#: 0.9 10*3/uL (ref 0.9–3.3)

## 2013-04-01 NOTE — Telephone Encounter (Signed)
Gave pt appt for MD visit on December , pt to come on 12/3 for Bone Marrow per MD, pt aware of appts

## 2013-04-01 NOTE — Progress Notes (Signed)
[facility] OFFICE PROGRESS NOTE  Rudi Heap, MD DIAGNOSIS:  Chronic anemia with leukopenia, B12 deficiency  SUMMARY OF HEMATOLOGIC HISTORY: This is a pleasant 79 year old gentleman who is being referred here 6 months ago because of leukopenia and anemia. During the course of the workup, he was found to have mild chronic renal failure and B12 deficiency. He was started on B12 supplements with monthly injection INTERVAL HISTORY: MOUA RASMUSSON 79 y.o. male returns for further followup. He denies any signs and symptoms of anemia such as fatigue, chest pain or shortness of breath on exertion. He denies any recent fever, chills, night sweats or abnormal weight loss The patient denies any recent signs or symptoms of bleeding such as spontaneous epistaxis, hematuria or hematochezia.  I have reviewed the past medical history, past surgical history, social history and family history with the patient and they are unchanged from previous note.  ALLERGIES:  is allergic to sulfonamide derivatives.  MEDICATIONS:  Current Outpatient Prescriptions  Medication Sig Dispense Refill  . albuterol (PROAIR HFA) 108 (90 BASE) MCG/ACT inhaler INHALE 2 PUFFS EVERY 4 HOURS AS NEEDED FOR WHEEZING  17 g  2  . aspirin 325 MG tablet Take 325 mg by mouth daily.      . budesonide-formoterol (SYMBICORT) 80-4.5 MCG/ACT inhaler Inhale 1 puff into the lungs 2 (two) times daily.       . castor oil liquid Take 30 mLs by mouth daily as needed. For constipation      . Cholecalciferol (VITAMIN D3) 2000 UNITS TABS Take 1 tablet by mouth daily.       . cloNIDine (CATAPRES) 0.3 MG tablet TAKE 1 TABLET BY MOUTH TWICE DAILY.  60 tablet  2  . cyanocobalamin (,VITAMIN B-12,) 1000 MCG/ML injection Inject 1 mL (1,000 mcg total) into the muscle every 30 (thirty) days.  1 mL  12  . docusate sodium (COLACE) 100 MG capsule Take 100 mg by mouth 2 (two) times daily.      . fenofibrate 160 MG tablet TAKE ONE TABLET BY  MOUTH ONCE DAILY.  30 tablet  5  . ferrous sulfate 325 (65 FE) MG tablet Take 650 mg by mouth 2 (two) times daily with a meal.      . glucose blood (ACCU-CHEK AVIVA PLUS) test strip Check gluose 4 times daily   250.02  100 each  0  . HYDROcodone-acetaminophen (NORCO/VICODIN) 5-325 MG per tablet Take 1 tablet by mouth every 6 (six) hours as needed for moderate pain.      Marland Kitchen loratadine (CLARITIN) 10 MG tablet Take 10 mg by mouth at bedtime.      Marland Kitchen losartan-hydrochlorothiazide (HYZAAR) 100-25 MG per tablet Take 1 tablet by mouth daily with breakfast.  30 tablet  1  . metFORMIN (GLUCOPHAGE) 500 MG tablet TAKE 1 TABLET BY MOUTH TWICE DAILY.  60 tablet  5  . methocarbamol (ROBAXIN) 500 MG tablet Take 500 mg by mouth 4 (four) times daily.      . Multiple Vitamin (MULTIVITAMIN WITH MINERALS) TABS Take 0.5 tablets by mouth 2 (two) times daily.      Marland Kitchen NIFEdipine (PROCARDIA-XL/ADALAT-CC/NIFEDICAL-XL) 30 MG 24 hr tablet Take 1 tablet (30 mg total) by mouth daily with breakfast.  30 tablet  1  . nitroGLYCERIN (NITROSTAT) 0.4 MG SL tablet Place 0.4 mg under the tongue every 5 (five) minutes as needed. For chest pain      . pantoprazole (PROTONIX) 40 MG tablet Take 1 tablet (40 mg total) by mouth  daily with breakfast.  30 tablet  5  . simvastatin (ZOCOR) 40 MG tablet TAKE 1 TABLET BY MOUTH AT BEDTIME FOR CHOLESTEROL.  30 tablet  5  . tamsulosin (FLOMAX) 0.4 MG CAPS capsule Take 1 capsule (0.4 mg total) by mouth daily.  30 capsule  0  . Zinc 50 MG CAPS Take 1 capsule by mouth daily with breakfast.       No current facility-administered medications for this visit.     REVIEW OF SYSTEMS:   Constitutional: Denies fevers, chills or night sweats Eyes: Denies blurriness of vision Ears, nose, mouth, throat, and face: Denies mucositis or sore throat Respiratory: Denies cough, dyspnea or wheezes Cardiovascular: Denies palpitation, chest discomfort or lower extremity swelling Gastrointestinal:  Denies nausea,  heartburn or change in bowel habits Skin: Denies abnormal skin rashes Lymphatics: Denies new lymphadenopathy or easy bruising Neurological:Denies numbness, tingling or new weaknesses Behavioral/Psych: Mood is stable, no new changes  All other systems were reviewed with the patient and are negative.  PHYSICAL EXAMINATION: ECOG PERFORMANCE STATUS: 1 - Symptomatic but completely ambulatory  Filed Vitals:   04/01/13 1430  BP: 132/70  Pulse: 60  Temp: 97.7 F (36.5 C)  Resp: 18   Filed Weights   04/01/13 1430  Weight: 188 lb 11.2 oz (85.594 kg)    GENERAL:alert, no distress and comfortable SKIN:  the patient looks pale  EYES: normal, Conjunctiva are pink and non-injected, sclera clear OROPHARYNX:no exudate, no erythema and lips, buccal mucosa, and tongue normal  NECK: supple, thyroid normal size, non-tender, without nodularity LYMPH:  no palpable lymphadenopathy in the cervical, axillary or inguinal LUNGS: clear to auscultation and percussion with normal breathing effort HEART: regular rate & rhythm and no murmurs and no lower extremity edema ABDOMEN:abdomen soft, non-tender and normal bowel sounds Musculoskeletal:no cyanosis of digits and no clubbing  NEURO: alert & oriented x 3 with fluent speech, no focal motor/sensory deficits  LABORATORY DATA:  I have reviewed the data as listed Results for orders placed in visit on 04/01/13 (from the past 48 hour(s))  CBC & DIFF AND RETIC     Status: Abnormal   Collection Time    04/01/13  2:12 PM      Result Value Range   WBC 3.1 (*) 4.0 - 10.3 10e3/uL   NEUT# 1.6  1.5 - 6.5 10e3/uL   HGB 10.3 (*) 13.0 - 17.1 g/dL   HCT 16.1 (*) 09.6 - 04.5 %   Platelets 320  140 - 400 10e3/uL   MCV 100.6 (*) 79.3 - 98.0 fL   MCH 32.4  27.2 - 33.4 pg   MCHC 32.2  32.0 - 36.0 g/dL   RBC 4.09 (*) 8.11 - 9.14 10e6/uL   RDW 13.3  11.0 - 14.6 %   lymph# 0.9  0.9 - 3.3 10e3/uL   MONO# 0.4  0.1 - 0.9 10e3/uL   Eosinophils Absolute 0.1  0.0 - 0.5  10e3/uL   Basophils Absolute 0.0  0.0 - 0.1 10e3/uL   NEUT% 53.8  39.0 - 75.0 %   LYMPH% 29.2  14.0 - 49.0 %   MONO% 14.1 (*) 0.0 - 14.0 %   EOS% 2.6  0.0 - 7.0 %   BASO% 0.3  0.0 - 2.0 %   Retic % 1.74  0.80 - 1.80 %   Retic Ct Abs 55.33  34.80 - 93.90 10e3/uL   Immature Retic Fract 8.40  3.00 - 10.60 %    Lab Results  Component Value Date  WBC 3.1* 04/01/2013   HGB 10.3* 04/01/2013   HCT 32.0* 04/01/2013   MCV 100.6* 04/01/2013   PLT 320 04/01/2013  ASSESSMENT & PLAN:  #1 anemia With the evidence of macrocytic anemia, I am concerned about undiagnosed myelodysplastic syndrome. He had history of B12 deficiency and had been on replacement therapy for over 4 months with no appreciatable improvement of his hemoglobin. I recommend bone marrow aspirate and biopsy. I discussed with him in great detail about the procedure. The patient elected to have bone marrow aspirate and biopsy done in an unsedated fashion. The patient denies recent history of bleeding such as epistaxis, hematuria or hematochezia. He is asymptomatic from the anemia. We will observe for now.  He does not require transfusion now.  #2 leukopenia Again, as mentioned above I'm concerned about myelodysplastic syndrome. He has relative monocytosis. I recommend bone marrow aspirate and biopsy. #3 B12 deficiency He will continue B12 replacement therapy under the care of his primary care provider #4 chronic kidney disease The patient is relatively asymptomatic. I have ordered serum erythropoietin level today. He may benefit from erythropoietin stimulating agents in the future.  All questions were answered. The patient knows to call the clinic with any problems, questions or concerns. No barriers to learning was detected.  I spent 25 minutes counseling the patient face to face. The total time spent in the appointment was 40 minutes and more than 50% was on counseling.     Leydi Winstead, MD 04/01/2013 3:12 PM

## 2013-04-01 NOTE — Telephone Encounter (Signed)
BMBx scheduled for 12/03 at 11:30 am in Infusion room at [facility].   Notified Carter in [employer].

## 2013-04-15 ENCOUNTER — Other Ambulatory Visit (HOSPITAL_COMMUNITY)
Admission: RE | Admit: 2013-04-15 | Discharge: 2013-04-15 | Disposition: A | Discharge status: Home / Self Care | Payer: Medicare Other | Source: Ambulatory Visit | Attending: Hematology and Oncology | Admitting: Hematology and Oncology

## 2013-04-15 ENCOUNTER — Other Ambulatory Visit: Payer: Medicare Other | Admitting: Lab

## 2013-04-15 ENCOUNTER — Encounter (HOSPITAL_BASED_OUTPATIENT_CLINIC_OR_DEPARTMENT_OTHER): Payer: Medicare Other | Admitting: Hematology and Oncology

## 2013-04-15 ENCOUNTER — Ambulatory Visit: Payer: Medicare Other | Admitting: Hematology and Oncology

## 2013-04-15 VITALS — BP 128/69 | HR 79 | Temp 98.7°F | Resp 18

## 2013-04-15 DIAGNOSIS — D704 Cyclic neutropenia: Secondary | ICD-10-CM | POA: Insufficient documentation

## 2013-04-15 DIAGNOSIS — D649 Anemia, unspecified: Secondary | ICD-10-CM

## 2013-04-15 DIAGNOSIS — D72819 Decreased white blood cell count, unspecified: Secondary | ICD-10-CM

## 2013-04-15 DIAGNOSIS — D539 Nutritional anemia, unspecified: Secondary | ICD-10-CM | POA: Insufficient documentation

## 2013-04-15 LAB — CBC
HCT: 31 % — ABNORMAL LOW (ref 39.0–52.0)
Hemoglobin: 10.2 g/dL — ABNORMAL LOW (ref 13.0–17.0)
MCH: 33.1 pg (ref 26.0–34.0)
MCHC: 32.9 g/dL (ref 30.0–36.0)
MCV: 100.6 fL — ABNORMAL HIGH (ref 78.0–100.0)
Platelets: 216 K/uL (ref 150–400)
RBC: 3.08 MIL/uL — ABNORMAL LOW (ref 4.22–5.81)
RDW: 13.4 % (ref 11.5–15.5)
WBC: 2.2 K/uL — ABNORMAL LOW (ref 4.0–10.5)

## 2013-04-15 LAB — BONE MARROW EXAM

## 2013-04-15 NOTE — Patient Instructions (Signed)
[facility] Discharge Instructions for Post Bone Marrow Procedure  Today you had a bone marrow biopsy and aspirate of right hip.  Please keep the pressure dressing in place for at least 24 hours.  Have someone check your dressing periodically for bleeding.  If needed you can reapply a pressure dressing to the site.  Take pain medication as directed.  IF BLEEDING REOCCURS THAT SHOULD BE REPORTED IMMEDIATELY. Call the Cancer Center at [phone] if during business hours. Or report to the Emergency Room.   I have been informed and understand all the instructions given to me. I know to contact the clinic, my physician, or go to the Emergency Department if any problems should occur. I do not have any questions at this time, but understand that I may call the clinic during office hours at [phone]  should I have any questions or need assistance in obtaining follow up care.    __________________________________________  _____________  __________ Signature of Patient or Authorized Representative            Date                   Time    __________________________________________ Nurse's Signature     

## 2013-04-15 NOTE — Progress Notes (Unsigned)
Reviewed bone marrow biopsy procedure with patient and daughter. Consent signed after MD also reviewed procedure. Tolerated well. Upon discharge, dressing is clean dry and intact.

## 2013-04-15 NOTE — Patient Instructions (Signed)
[facility] Discharge Instructions for Post Bone Marrow Procedure  Today you had a bone marrow biopsy and aspirate of right hip.  Please keep the pressure dressing in place for at least 24 hours.  Have someone check your dressing periodically for bleeding.  If needed you can reapply a pressure dressing to the site.  Take pain medication as directed.  IF BLEEDING REOCCURS THAT SHOULD BE REPORTED IMMEDIATELY. Call the Cancer Center at [phone] if during business hours. Or report to the Emergency Room.   I have been informed and understand all the instructions given to me. I know to contact the clinic, my physician, or go to the Emergency Department if any problems should occur. I do not have any questions at this time, but understand that I may call the clinic during office hours at [phone]  should I have any questions or need assistance in obtaining follow up care.    __________________________________________  _____________  __________ Signature of Patient or Authorized Representative            Date                   Time    __________________________________________ Nurse's Signature     

## 2013-04-15 NOTE — Progress Notes (Signed)
Bone Marrow Biopsy and Aspiration Procedure Note   Informed consent was obtained and potential risks including bleeding, infection and pain were reviewed with the patient.  The patient's name, date of birth, identification, consent and allergies were verified prior to the start of procedure and time out was performed. The right posterior iliac crest was chosen as the site of biopsy.  The skin was prepped with Betadine solution.   5 cc of 2% lidocaine was used to provide local anaesthesia.   10 cc of bone marrow aspirate was obtained followed by 1 inch biopsy.   The procedure was tolerated well and there were no complications.  The patient was stable at the end of the procedure.  Specimens sent for flow cytometry, cytogenetics and additional studies.  This encounter was created in error - please disregard.

## 2013-04-21 ENCOUNTER — Other Ambulatory Visit: Payer: Self-pay | Admitting: Nurse Practitioner

## 2013-04-21 ENCOUNTER — Other Ambulatory Visit: Payer: Self-pay | Admitting: Family Medicine

## 2013-04-22 ENCOUNTER — Telehealth: Payer: Self-pay | Admitting: Hematology and Oncology

## 2013-04-22 ENCOUNTER — Ambulatory Visit (HOSPITAL_BASED_OUTPATIENT_CLINIC_OR_DEPARTMENT_OTHER): Payer: Medicare Other

## 2013-04-22 ENCOUNTER — Encounter: Payer: Self-pay | Admitting: Hematology and Oncology

## 2013-04-22 ENCOUNTER — Ambulatory Visit (HOSPITAL_BASED_OUTPATIENT_CLINIC_OR_DEPARTMENT_OTHER): Payer: Medicare Other | Admitting: Hematology and Oncology

## 2013-04-22 VITALS — BP 139/70 | HR 56 | Temp 97.8°F | Resp 18 | Ht 65.0 in | Wt 192.2 lb

## 2013-04-22 DIAGNOSIS — D72819 Decreased white blood cell count, unspecified: Secondary | ICD-10-CM

## 2013-04-22 DIAGNOSIS — E538 Deficiency of other specified B group vitamins: Secondary | ICD-10-CM

## 2013-04-22 DIAGNOSIS — N189 Chronic kidney disease, unspecified: Secondary | ICD-10-CM

## 2013-04-22 DIAGNOSIS — R5381 Other malaise: Secondary | ICD-10-CM

## 2013-04-22 DIAGNOSIS — R5383 Other fatigue: Secondary | ICD-10-CM

## 2013-04-22 DIAGNOSIS — D649 Anemia, unspecified: Secondary | ICD-10-CM

## 2013-04-22 HISTORY — DX: Other fatigue: R53.83

## 2013-04-22 HISTORY — DX: Deficiency of other specified B group vitamins: E53.8

## 2013-04-22 LAB — CBC & DIFF AND RETIC
BASO%: 0.4 % (ref 0.0–2.0)
Basophils Absolute: 0 10*3/uL (ref 0.0–0.1)
Eosinophils Absolute: 0.1 10*3/uL (ref 0.0–0.5)
HCT: 30.6 % — ABNORMAL LOW (ref 38.4–49.9)
HGB: 10 g/dL — ABNORMAL LOW (ref 13.0–17.1)
Immature Retic Fract: 5.7 % (ref 3.00–10.60)
LYMPH%: 29.3 % (ref 14.0–49.0)
MCV: 100 fL — ABNORMAL HIGH (ref 79.3–98.0)
MONO#: 0.5 10*3/uL (ref 0.1–0.9)
NEUT#: 1.4 10*3/uL — ABNORMAL LOW (ref 1.5–6.5)
NEUT%: 51.7 % (ref 39.0–75.0)
WBC: 2.7 10*3/uL — ABNORMAL LOW (ref 4.0–10.3)
lymph#: 0.8 10*3/uL — ABNORMAL LOW (ref 0.9–3.3)

## 2013-04-22 NOTE — Telephone Encounter (Signed)
appts made per 12/10 POF AVs and CAL given shh

## 2013-04-22 NOTE — Progress Notes (Signed)
[facility] OFFICE PROGRESS NOTE  Patient Care Team: Ernestina Penna, MD as PCP - General (Family Medicine) Gaylord Shih, MD as Attending Physician (Cardiology) Louis Meckel, MD as Attending Physician (Gastroenterology) Artis Delay, MD as Consulting Physician (Hematology and Oncology)  DIAGNOSIS: Chronic anemia, leukopenia, B12 deficiency  SUMMARY OF ONCOLOGIC HISTORY: This is a pleasant 79 year old gentleman who is being referred here 6 months ago because of leukopenia and anemia. During the course of the workup, he was found to have mild chronic renal failure and B12 deficiency. He was started on B12 supplements with monthly injection. On 04/15/2013, the patient underwent bone marrow aspirate and biopsy  INTERVAL HISTORY: Collin Campbell 79 y.o. male returns for further workup. He complained of mild pain at the bone marrow biopsy site The patient denies any recent signs or symptoms of bleeding such as spontaneous epistaxis, hematuria or hematochezia. He denies any recent fever, chills, night sweats or abnormal weight loss Denies recent infection. He is compliant with B12 injection.  I have reviewed the past medical history, past surgical history, social history and family history with the patient and they are unchanged from previous note.  ALLERGIES:  is allergic to sulfonamide derivatives.  MEDICATIONS:  Current Outpatient Prescriptions  Medication Sig Dispense Refill  . albuterol (PROAIR HFA) 108 (90 BASE) MCG/ACT inhaler INHALE 2 PUFFS EVERY 4 HOURS AS NEEDED FOR WHEEZING  17 g  2  . aspirin 325 MG tablet Take 325 mg by mouth daily.      . budesonide-formoterol (SYMBICORT) 80-4.5 MCG/ACT inhaler Inhale 1 puff into the lungs 2 (two) times daily.       . castor oil liquid Take 30 mLs by mouth daily as needed. For constipation      . Cholecalciferol (VITAMIN D3) 2000 UNITS TABS Take 1 tablet by mouth daily.       . cloNIDine (CATAPRES) 0.3 MG tablet TAKE 1 TABLET  BY MOUTH TWICE DAILY.  60 tablet  2  . cyanocobalamin (,VITAMIN B-12,) 1000 MCG/ML injection Inject 1 mL (1,000 mcg total) into the muscle every 30 (thirty) days.  1 mL  12  . docusate sodium (COLACE) 100 MG capsule Take 100 mg by mouth 2 (two) times daily.      . fenofibrate 160 MG tablet TAKE ONE TABLET BY MOUTH ONCE DAILY.  30 tablet  5  . glucose blood (ACCU-CHEK AVIVA PLUS) test strip Check gluose 4 times daily   250.02  100 each  0  . HYDROcodone-acetaminophen (NORCO/VICODIN) 5-325 MG per tablet Take 1 tablet by mouth every 6 (six) hours as needed for moderate pain.      . Lancets (ACCU-CHEK MULTICLIX) lancets       . loratadine (CLARITIN) 10 MG tablet Take 10 mg by mouth at bedtime.      Marland Kitchen losartan-hydrochlorothiazide (HYZAAR) 100-25 MG per tablet Take 1 tablet by mouth daily with breakfast.  30 tablet  1  . metFORMIN (GLUCOPHAGE) 500 MG tablet TAKE 1 TABLET BY MOUTH TWICE DAILY.  60 tablet  5  . methocarbamol (ROBAXIN) 500 MG tablet Take 500 mg by mouth 4 (four) times daily.      . Multiple Vitamin (MULTIVITAMIN WITH MINERALS) TABS Take 0.5 tablets by mouth 2 (two) times daily.      Marland Kitchen NIFEdipine (PROCARDIA-XL/ADALAT-CC/NIFEDICAL-XL) 30 MG 24 hr tablet Take 1 tablet (30 mg total) by mouth daily with breakfast.  30 tablet  1  . pantoprazole (PROTONIX) 40 MG tablet Take 1 tablet (  40 mg total) by mouth daily with breakfast.  30 tablet  5  . simvastatin (ZOCOR) 40 MG tablet TAKE 1 TABLET BY MOUTH AT BEDTIME FOR CHOLESTEROL.  30 tablet  5  . tamsulosin (FLOMAX) 0.4 MG CAPS capsule Take 1 capsule (0.4 mg total) by mouth daily.  30 capsule  0  . Zinc 50 MG CAPS Take 1 capsule by mouth daily with breakfast.      . ferrous sulfate 325 (65 FE) MG tablet Take 650 mg by mouth 2 (two) times daily with a meal.      . nitroGLYCERIN (NITROSTAT) 0.4 MG SL tablet Place 0.4 mg under the tongue every 5 (five) minutes as needed. For chest pain       No current facility-administered medications for this visit.     REVIEW OF SYSTEMS:   Constitutional: Denies fevers, chills or abnormal weight loss Eyes: Denies blurriness of vision Ears, nose, mouth, throat, and face: Denies mucositis or sore throat Respiratory: Denies cough, dyspnea or wheezes Cardiovascular: Denies palpitation, chest discomfort or lower extremity swelling Gastrointestinal:  Denies nausea, heartburn or change in bowel habits Skin: Denies abnormal skin rashes Lymphatics: Denies new lymphadenopathy or easy bruising Neurological:Denies numbness, tingling or new weaknesses Behavioral/Psych: Mood is stable, no new changes  All other systems were reviewed with the patient and are negative.  PHYSICAL EXAMINATION: ECOG PERFORMANCE STATUS: 0 - Asymptomatic  Filed Vitals:   04/22/13 1400  BP: 139/70  Pulse: 56  Temp: 97.8 F (36.6 C)  Resp: 18   Filed Weights   04/22/13 1400  Weight: 192 lb 3.2 oz (87.181 kg)    GENERAL:alert, no distress and comfortable. He looks a bit pale  SKIN: skin color, texture, turgor are normal, no rashes or significant lesions. No rash  NEURO: alert & oriented x 3 with fluent speech, no focal motor/sensory deficits  LABORATORY DATA:  I have reviewed the data as listed    Component Value Date/Time   NA 141 03/20/2013 1212   NA 145 08/25/2012 1046   K 4.8 03/20/2013 1212   CL 101 03/20/2013 1212   CO2 24 03/20/2013 1212   GLUCOSE 83 03/20/2013 1212   GLUCOSE 107* 08/25/2012 1046   BUN 13 03/20/2013 1212   BUN 16 08/25/2012 1046   CREATININE 1.29* 03/20/2013 1212   CREATININE 1.56* 08/25/2012 1046   CALCIUM 9.9 03/20/2013 1212   PROT 6.4 03/20/2013 1212   PROT 6.4 08/25/2012 1046   ALBUMIN 3.9 08/25/2012 1046   AST 22 03/20/2013 1212   ALT 14 03/20/2013 1212   ALKPHOS 39 03/20/2013 1212   BILITOT 0.5 03/20/2013 1212   GFRNONAA 50* 03/20/2013 1212   GFRAA 58* 03/20/2013 1212    No results found for this basename: SPEP,  UPEP,   kappa and lambda light chains    Lab Results  Component Value Date   WBC  2.2* 04/15/2013   NEUTROABS 1.6 04/01/2013   HGB 10.2* 04/15/2013   HCT 31.0* 04/15/2013   MCV 100.6* 04/15/2013   PLT 216 04/15/2013      Chemistry      Component Value Date/Time   NA 141 03/20/2013 1212   NA 145 08/25/2012 1046   K 4.8 03/20/2013 1212   CL 101 03/20/2013 1212   CO2 24 03/20/2013 1212   BUN 13 03/20/2013 1212   BUN 16 08/25/2012 1046   CREATININE 1.29* 03/20/2013 1212   CREATININE 1.56* 08/25/2012 1046      Component Value Date/Time   CALCIUM  9.9 03/20/2013 1212   ALKPHOS 39 03/20/2013 1212   AST 22 03/20/2013 1212   ALT 14 03/20/2013 1212   BILITOT 0.5 03/20/2013 1212       ASSESSMENT & PLAN:  #1 leukopenia Causes unknown. Certainly B12 deficiency can cause it. We will recheck a B12 level today. I will also order an additional test to rule out LGL leukemia #2 anemia His bone marrow appears to be hypercellular. Serum erythropoietin was low. Iron studies were normal. I will order an additional workup to rule out hemolysis #3 chronic kidney disease He has elevated creatinine. I am surprised to see hypercellular bone marrow. #4 B12 deficiency I will recheck B12 level today to make sure he is adequately replaced. Overall, his bone marrow biopsy could not explain the cause of his abnormal CBC. I will present his case at tumor Board next week. I'll see him back next week for further management of his abnormal blood work.   Orders Placed This Encounter  Procedures  . CBC & Diff and Retic    Standing Status: Future     Number of Occurrences:      Standing Expiration Date: 04/22/2014  . T4, free    Standing Status: Future     Number of Occurrences:      Standing Expiration Date: 04/22/2014  . TSH    Standing Status: Future     Number of Occurrences:      Standing Expiration Date: 04/22/2014  . Vitamin B12    Standing Status: Future     Number of Occurrences:      Standing Expiration Date: 04/22/2014  . Sedimentation rate    Standing Status: Future     Number of  Occurrences:      Standing Expiration Date: 04/22/2014  . PNH Profile (-High Sensitivity)    Standing Status: Future     Number of Occurrences:      Standing Expiration Date: 04/22/2014  . Barbaraann Faster Test    Standing Status: Future     Number of Occurrences:      Standing Expiration Date: 04/22/2014    Order Specific Question:  Test Name:    Answer:  T cell rearrangement study  . Direct antiglobulin test    Standing Status: Future     Number of Occurrences:      Standing Expiration Date: 04/22/2014   All questions were answered. The patient knows to call the clinic with any problems, questions or concerns. No barriers to learning was detected. I spent 25 minutes counseling the patient face to face. The total time spent in the appointment was 40 minutes and more than 50% was on counseling and review of test results     [facility], Jon Kasparek, MD 04/22/2013 3:09 PM

## 2013-04-23 ENCOUNTER — Telehealth: Payer: Self-pay | Admitting: *Deleted

## 2013-04-23 LAB — TSH CHCC: TSH: 2.279 m(IU)/L (ref 0.320–4.118)

## 2013-04-23 LAB — VITAMIN B12: Vitamin B-12: 284 pg/mL (ref 211–911)

## 2013-04-23 LAB — T4, FREE: Free T4: 0.87 ng/dL (ref 0.80–1.80)

## 2013-04-23 NOTE — Telephone Encounter (Signed)
If he wants to but may make him feel fatigued- really  Should continue!

## 2013-04-24 ENCOUNTER — Other Ambulatory Visit: Payer: Self-pay | Admitting: *Deleted

## 2013-04-24 MED ORDER — LOSARTAN POTASSIUM-HCTZ 100-25 MG PO TABS: 1.0000 | ORAL_TABLET | Freq: Every day | ORAL | Status: DC

## 2013-04-24 NOTE — Telephone Encounter (Signed)
Patient aware and said will continue.

## 2013-04-27 LAB — TISSUE HYBRIDIZATION (BONE MARROW)-NCBH

## 2013-04-28 LAB — SEDIMENTATION RATE: Sed Rate: 11 mm/hr (ref 0–16)

## 2013-04-28 LAB — PNH PROFILE (-HIGH SENSITIVITY): Viability (%): 88 %

## 2013-04-29 ENCOUNTER — Other Ambulatory Visit: Payer: Self-pay | Admitting: Hematology and Oncology

## 2013-04-29 ENCOUNTER — Ambulatory Visit (HOSPITAL_BASED_OUTPATIENT_CLINIC_OR_DEPARTMENT_OTHER): Payer: Medicare Other | Admitting: Hematology and Oncology

## 2013-04-29 ENCOUNTER — Other Ambulatory Visit (HOSPITAL_BASED_OUTPATIENT_CLINIC_OR_DEPARTMENT_OTHER): Payer: Medicare Other

## 2013-04-29 ENCOUNTER — Encounter: Payer: Self-pay | Admitting: Hematology and Oncology

## 2013-04-29 VITALS — BP 120/62 | HR 51 | Temp 98.2°F | Resp 18 | Ht 65.0 in | Wt 191.3 lb

## 2013-04-29 DIAGNOSIS — E538 Deficiency of other specified B group vitamins: Secondary | ICD-10-CM

## 2013-04-29 DIAGNOSIS — D469 Myelodysplastic syndrome, unspecified: Secondary | ICD-10-CM

## 2013-04-29 DIAGNOSIS — D72819 Decreased white blood cell count, unspecified: Secondary | ICD-10-CM

## 2013-04-29 DIAGNOSIS — D649 Anemia, unspecified: Secondary | ICD-10-CM

## 2013-04-29 LAB — BASIC METABOLIC PANEL (CC13)
Anion Gap: 9 mEq/L (ref 3–11)
Creatinine: 1.2 mg/dL (ref 0.7–1.3)
Potassium: 4 mEq/L (ref 3.5–5.1)
Sodium: 141 mEq/L (ref 136–145)

## 2013-04-29 LAB — CBC & DIFF AND RETIC
BASO%: 0.4 % (ref 0.0–2.0)
EOS%: 3.1 % (ref 0.0–7.0)
Eosinophils Absolute: 0.1 10*3/uL (ref 0.0–0.5)
Immature Retic Fract: 3.7 % (ref 3.00–10.60)
LYMPH%: 29 % (ref 14.0–49.0)
MCHC: 32.9 g/dL (ref 32.0–36.0)
MONO#: 0.4 10*3/uL (ref 0.1–0.9)
NEUT#: 1.3 10*3/uL — ABNORMAL LOW (ref 1.5–6.5)
Platelets: 194 10*3/uL (ref 140–400)
RBC: 3.09 10*6/uL — ABNORMAL LOW (ref 4.20–5.82)
RDW: 13.4 % (ref 11.0–14.6)
Retic %: 1.78 % (ref 0.80–1.80)
Retic Ct Abs: 55 10*3/uL (ref 34.80–93.90)
WBC: 2.6 10*3/uL — ABNORMAL LOW (ref 4.0–10.3)
lymph#: 0.8 10*3/uL — ABNORMAL LOW (ref 0.9–3.3)

## 2013-04-29 NOTE — Progress Notes (Signed)
[facility] OFFICE PROGRESS NOTE  Patient Care Team: Ernestina Penna, MD as PCP - General (Family Medicine) Gaylord Shih, MD as Attending Physician (Cardiology) Louis Meckel, MD as Attending Physician (Gastroenterology) Artis Delay, MD as Consulting Physician (Hematology and Oncology)  DIAGNOSIS: Chronic anemia and leukopenia with abnormal bone marrow biopsy, likely early myelodysplastic syndrome  SUMMARY OF ONCOLOGIC HISTORY: This is a pleasant 79 year old gentleman who is being referred here 6 months ago because of leukopenia and anemia. During the course of the workup, he was found to have mild chronic renal failure and B12 deficiency. He was started on B12 supplements with monthly injection. On 04/15/2013, the patient underwent bone marrow aspirate and biopsy. Additional workup for T-cell rearrangement study and screen for PNH were negative. 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 male returns for further followup. He complained of mild fatigue but overall able to perform all activities of daily living. Denies any recent infection.  I have reviewed the past medical history, past surgical history, social history and family history with the patient and they are unchanged from previous note.  ALLERGIES:  is allergic to sulfonamide derivatives.  MEDICATIONS:  Current Outpatient Prescriptions  Medication Sig Dispense Refill  . albuterol (PROAIR HFA) 108 (90 BASE) MCG/ACT inhaler INHALE 2 PUFFS EVERY 4 HOURS AS NEEDED FOR WHEEZING  17 g  2  . aspirin 325 MG tablet Take 325 mg by mouth daily.      . budesonide-formoterol (SYMBICORT) 80-4.5 MCG/ACT inhaler Inhale 1 puff into the lungs 2 (two) times daily.       . castor oil liquid Take 30 mLs by mouth daily as needed. For constipation      . Cholecalciferol (VITAMIN D3) 2000 UNITS TABS Take 1 tablet by mouth daily.        . cloNIDine (CATAPRES) 0.3 MG tablet TAKE 1 TABLET BY MOUTH TWICE DAILY.  60 tablet  4  . cyanocobalamin (,VITAMIN B-12,) 1000 MCG/ML injection Inject 1 mL (1,000 mcg total) into the muscle every 30 (thirty) days.  1 mL  12  . docusate sodium (COLACE) 100 MG capsule Take 100 mg by mouth 2 (two) times daily.      . fenofibrate 160 MG tablet TAKE ONE TABLET BY MOUTH ONCE DAILY.  30 tablet  4  . glucose blood (ACCU-CHEK AVIVA PLUS) test strip Check gluose 4 times daily   250.02  100 each  0  . HYDROcodone-acetaminophen (NORCO/VICODIN) 5-325 MG per tablet Take 1 tablet by mouth every 6 (six) hours as needed for moderate pain.      . Lancets (ACCU-CHEK MULTICLIX) lancets       . loratadine (CLARITIN) 10 MG tablet Take 10 mg by mouth at bedtime.      Marland Kitchen losartan-hydrochlorothiazide (HYZAAR) 100-25 MG per tablet Take 1 tablet by mouth daily with breakfast.  30 tablet  4  . metFORMIN (GLUCOPHAGE) 500 MG tablet TAKE 1 TABLET BY MOUTH TWICE DAILY.  60 tablet  5  . methocarbamol (ROBAXIN) 500 MG tablet Take 500 mg by mouth 4 (four) times daily.      Marland Kitchen NIFEdipine (PROCARDIA-XL/ADALAT-CC/NIFEDICAL-XL) 30 MG 24 hr tablet TAKE ONE TABLET BY MOUTH DAILY.  30 tablet  4  . nitroGLYCERIN (NITROSTAT) 0.4 MG SL tablet Place 0.4 mg under the tongue every 5 (five) minutes as needed. For chest pain      . pantoprazole (PROTONIX) 40 MG  tablet Take 1 tablet (40 mg total) by mouth daily with breakfast.  30 tablet  5  . simvastatin (ZOCOR) 40 MG tablet TAKE 1 TABLET BY MOUTH AT BEDTIME FOR CHOLESTEROL.  30 tablet  4  . tamsulosin (FLOMAX) 0.4 MG CAPS capsule TAKE 1 CAPSULE BY MOUTH ONCE DAILY.  30 capsule  4  . Zinc 50 MG CAPS Take 1 capsule by mouth daily with breakfast.      . ferrous sulfate 325 (65 FE) MG tablet Take 650 mg by mouth 2 (two) times daily with a meal.      . Multiple Vitamin (MULTIVITAMIN WITH MINERALS) TABS Take 0.5 tablets by mouth 2 (two) times daily.       No current facility-administered  medications for this visit.    REVIEW OF SYSTEMS:   Constitutional: Denies fevers, chills or abnormal weight loss Eyes: Denies blurriness of vision Ears, nose, mouth, throat, and face: Denies mucositis or sore throat Respiratory: Denies cough, dyspnea or wheezes Cardiovascular: Denies palpitation, chest discomfort or lower extremity swelling Gastrointestinal:  Denies nausea, heartburn or change in bowel habits Skin: Denies abnormal skin rashes Lymphatics: Denies new lymphadenopathy or easy bruising Neurological:Denies numbness, tingling or new weaknesses Behavioral/Psych: Mood is stable, no new changes  All other systems were reviewed with the patient and are negative.  PHYSICAL EXAMINATION: ECOG PERFORMANCE STATUS: 0 - Asymptomatic  Filed Vitals:   04/29/13 1254  BP: 120/62  Pulse: 51  Temp: 98.2 F (36.8 C)  Resp: 18   Filed Weights   04/29/13 1254  Weight: 191 lb 4.8 oz (86.773 kg)    GENERAL:alert, no distress and comfortable SKIN: skin color, texture, turgor are normal, no rashes or significant lesions Musculoskeletal:no cyanosis of digits and no clubbing  NEURO: alert & oriented x 3 with fluent speech, no focal motor/sensory deficits  LABORATORY DATA:  I have reviewed the data as listed    Component Value Date/Time   NA 141 04/29/2013 1243   NA 141 03/20/2013 1212   NA 145 08/25/2012 1046   K 4.0 04/29/2013 1243   K 4.8 03/20/2013 1212   CL 101 03/20/2013 1212   CO2 27 04/29/2013 1243   CO2 24 03/20/2013 1212   GLUCOSE 152* 04/29/2013 1243   GLUCOSE 83 03/20/2013 1212   GLUCOSE 107* 08/25/2012 1046   BUN 11.8 04/29/2013 1243   BUN 13 03/20/2013 1212   BUN 16 08/25/2012 1046   CREATININE 1.2 04/29/2013 1243   CREATININE 1.29* 03/20/2013 1212   CREATININE 1.56* 08/25/2012 1046   CALCIUM 9.4 04/29/2013 1243   CALCIUM 9.9 03/20/2013 1212   PROT 6.4 03/20/2013 1212   PROT 6.4 08/25/2012 1046   ALBUMIN 3.9 08/25/2012 1046   AST 22 03/20/2013 1212   ALT 14 03/20/2013  1212   ALKPHOS 39 03/20/2013 1212   BILITOT 0.5 03/20/2013 1212   GFRNONAA 50* 03/20/2013 1212   GFRAA 58* 03/20/2013 1212    No results found for this basename: SPEP,  UPEP,   kappa and lambda light chains    Lab Results  Component Value Date   WBC 2.6* 04/29/2013   NEUTROABS 1.3* 04/29/2013   HGB 10.2* 04/29/2013   HCT 31.0* 04/29/2013   MCV 100.3* 04/29/2013   PLT 194 04/29/2013      Chemistry      Component Value Date/Time   NA 141 04/29/2013 1243   NA 141 03/20/2013 1212   NA 145 08/25/2012 1046   K 4.0 04/29/2013 1243   K 4.8  03/20/2013 1212   CL 101 03/20/2013 1212   CO2 27 04/29/2013 1243   CO2 24 03/20/2013 1212   BUN 11.8 04/29/2013 1243   BUN 13 03/20/2013 1212   BUN 16 08/25/2012 1046   CREATININE 1.2 04/29/2013 1243   CREATININE 1.29* 03/20/2013 1212   CREATININE 1.56* 08/25/2012 1046      Component Value Date/Time   CALCIUM 9.4 04/29/2013 1243   CALCIUM 9.9 03/20/2013 1212   ALKPHOS 39 03/20/2013 1212   AST 22 03/20/2013 1212   ALT 14 03/20/2013 1212   BILITOT 0.5 03/20/2013 1212      ASSESSMENT & PLAN:  #1 early myelodysplastic syndrome I presented his case at the hematology conference recently. Overall picture is consistent with early myelodysplastic syndrome. I discussed with the patient natural history of myelodysplastic syndrome. His calculated revised international prognostic scoring system put him at very low risk category with overall survival greater than 8 years. I recommend observation only. I discussed with the patient and his family members about signs and symptoms to watch out for disease progression including unresolved infection, progressive anemia with symptoms of fatigue, chest pain or shortness of breath and unexplained bruising or bleeding. #2 anemia This is likely anemia of chronic disease. The patient denies recent history of bleeding such as epistaxis, hematuria or hematochezia. He is asymptomatic from the anemia. We will observe for now.   He does not require transfusion now.  #3 history of B12 deficiency I recommend the patient to take oral B12 supplements. Orders Placed This Encounter  Procedures  . CBC & Diff and Retic    Standing Status: Future     Number of Occurrences:      Standing Expiration Date: 04/29/2014  . Comprehensive metabolic panel    Standing Status: Future     Number of Occurrences:      Standing Expiration Date: 04/29/2014   All questions were answered. The patient knows to call the clinic with any problems, questions or concerns. No barriers to learning was detected. I spent 25 minutes counseling the patient face to face. The total time spent in the appointment was 40 minutes and more than 50% was on counseling and review of test results     [facility], Collin Heyliger, MD 04/29/2013 2:10 PM

## 2013-04-30 ENCOUNTER — Telehealth: Payer: Self-pay | Admitting: Hematology and Oncology

## 2013-04-30 NOTE — Telephone Encounter (Signed)
lvm for pt regarding to March 2015 appt...mailed pt appt sched and avs with letter

## 2013-05-11 ENCOUNTER — Encounter (HOSPITAL_COMMUNITY): Payer: Self-pay

## 2013-06-09 ENCOUNTER — Other Ambulatory Visit: Payer: Self-pay

## 2013-06-09 MED ORDER — CLONIDINE HCL 0.3 MG PO TABS: 0.3000 mg | ORAL_TABLET | Freq: Two times a day (BID) | ORAL | Status: DC

## 2013-06-09 MED ORDER — TAMSULOSIN HCL 0.4 MG PO CAPS: 0.4000 mg | ORAL_CAPSULE | Freq: Every day | ORAL | Status: DC

## 2013-06-09 MED ORDER — FENOFIBRATE 160 MG PO TABS: 160.0000 mg | ORAL_TABLET | Freq: Every day | ORAL | Status: DC

## 2013-06-09 MED ORDER — PANTOPRAZOLE SODIUM 40 MG PO TBEC: 40.0000 mg | DELAYED_RELEASE_TABLET | Freq: Every day | ORAL | Status: DC

## 2013-06-09 MED ORDER — METHOCARBAMOL 500 MG PO TABS: 500.0000 mg | ORAL_TABLET | Freq: Four times a day (QID) | ORAL | Status: DC

## 2013-06-09 MED ORDER — SIMVASTATIN 40 MG PO TABS: 40.0000 mg | ORAL_TABLET | Freq: Every day | ORAL | Status: DC

## 2013-06-09 MED ORDER — LOSARTAN POTASSIUM-HCTZ 100-25 MG PO TABS: 1.0000 | ORAL_TABLET | Freq: Every day | ORAL | Status: DC

## 2013-06-09 MED ORDER — METFORMIN HCL 500 MG PO TABS: ORAL_TABLET | ORAL | Status: DC

## 2013-06-09 MED ORDER — NIFEDIPINE ER OSMOTIC RELEASE 30 MG PO TB24: 30.0000 mg | ORAL_TABLET | Freq: Every day | ORAL | Status: DC

## 2013-06-09 NOTE — Telephone Encounter (Signed)
Last seen 03/30/13  MMM    Last glucose 04/24/13  Last lipid 03/30/13  If approved print for mail  Order and route to nurse

## 2013-06-12 ENCOUNTER — Telehealth: Payer: Self-pay | Admitting: Nurse Practitioner

## 2013-06-15 MED ORDER — METFORMIN HCL 500 MG PO TABS: ORAL_TABLET | ORAL | Status: DC

## 2013-06-15 MED ORDER — METHOCARBAMOL 500 MG PO TABS: 500.0000 mg | ORAL_TABLET | Freq: Four times a day (QID) | ORAL | Status: DC

## 2013-06-15 MED ORDER — FENOFIBRATE 160 MG PO TABS: 160.0000 mg | ORAL_TABLET | Freq: Every day | ORAL | Status: DC

## 2013-06-15 MED ORDER — LOSARTAN POTASSIUM-HCTZ 100-25 MG PO TABS: 1.0000 | ORAL_TABLET | Freq: Every day | ORAL | Status: DC

## 2013-06-15 MED ORDER — CLONIDINE HCL 0.3 MG PO TABS: 0.3000 mg | ORAL_TABLET | Freq: Two times a day (BID) | ORAL | Status: DC

## 2013-06-15 MED ORDER — PANTOPRAZOLE SODIUM 40 MG PO TBEC: 40.0000 mg | DELAYED_RELEASE_TABLET | Freq: Every day | ORAL | Status: DC

## 2013-06-15 MED ORDER — SIMVASTATIN 40 MG PO TABS: 40.0000 mg | ORAL_TABLET | Freq: Every day | ORAL | Status: DC

## 2013-06-15 MED ORDER — NIFEDIPINE ER OSMOTIC RELEASE 30 MG PO TB24: 30.0000 mg | ORAL_TABLET | Freq: Every day | ORAL | Status: DC

## 2013-06-15 MED ORDER — TAMSULOSIN HCL 0.4 MG PO CAPS: 0.4000 mg | ORAL_CAPSULE | Freq: Every day | ORAL | Status: DC

## 2013-06-15 NOTE — Telephone Encounter (Signed)
done

## 2013-06-17 ENCOUNTER — Other Ambulatory Visit: Payer: Self-pay | Admitting: *Deleted

## 2013-06-17 MED ORDER — NIFEDIPINE ER OSMOTIC RELEASE 30 MG PO TB24: 30.0000 mg | ORAL_TABLET | Freq: Every day | ORAL | Status: DC

## 2013-06-17 MED ORDER — METFORMIN HCL 500 MG PO TABS: ORAL_TABLET | ORAL | Status: DC

## 2013-06-17 MED ORDER — FENOFIBRATE 160 MG PO TABS: 160.0000 mg | ORAL_TABLET | Freq: Every day | ORAL | Status: DC

## 2013-06-17 MED ORDER — LOSARTAN POTASSIUM-HCTZ 100-25 MG PO TABS: 1.0000 | ORAL_TABLET | Freq: Every day | ORAL | Status: DC

## 2013-06-17 MED ORDER — PANTOPRAZOLE SODIUM 40 MG PO TBEC: 40.0000 mg | DELAYED_RELEASE_TABLET | Freq: Every day | ORAL | Status: DC

## 2013-06-17 MED ORDER — METHOCARBAMOL 500 MG PO TABS: 500.0000 mg | ORAL_TABLET | Freq: Four times a day (QID) | ORAL | Status: DC

## 2013-06-17 MED ORDER — CLONIDINE HCL 0.3 MG PO TABS: 0.3000 mg | ORAL_TABLET | Freq: Two times a day (BID) | ORAL | Status: DC

## 2013-06-17 MED ORDER — SIMVASTATIN 40 MG PO TABS: 40.0000 mg | ORAL_TABLET | Freq: Every day | ORAL | Status: DC

## 2013-06-17 MED ORDER — TAMSULOSIN HCL 0.4 MG PO CAPS: 0.4000 mg | ORAL_CAPSULE | Freq: Every day | ORAL | Status: DC

## 2013-06-17 NOTE — Telephone Encounter (Signed)
Patient needed rx sent into [facility] and patient stated these medications were correct and ok per [facility] to send in electronically.

## 2013-06-24 ENCOUNTER — Other Ambulatory Visit: Payer: Self-pay | Admitting: Nurse Practitioner

## 2013-06-26 ENCOUNTER — Other Ambulatory Visit: Payer: Self-pay | Admitting: Family Medicine

## 2013-06-28 NOTE — Telephone Encounter (Signed)
ntbs

## 2013-06-29 ENCOUNTER — Telehealth: Payer: Self-pay | Admitting: Nurse Practitioner

## 2013-06-29 MED ORDER — AZITHROMYCIN 250 MG PO TABS: ORAL_TABLET | ORAL | Status: DC

## 2013-06-29 NOTE — Telephone Encounter (Signed)
rx sent to pharmacy

## 2013-06-30 NOTE — Telephone Encounter (Signed)
Patient aware and he has already got his medication

## 2013-07-01 ENCOUNTER — Other Ambulatory Visit: Payer: Self-pay | Admitting: Nurse Practitioner

## 2013-07-16 ENCOUNTER — Other Ambulatory Visit: Payer: Self-pay | Admitting: Nurse Practitioner

## 2013-07-16 NOTE — Telephone Encounter (Signed)
Last seen 03/20/13  MMM

## 2013-07-20 ENCOUNTER — Telehealth: Payer: Self-pay | Admitting: Hematology and Oncology

## 2013-07-20 NOTE — Telephone Encounter (Signed)
pt called to cx appt due to no money for copay....done.Marland KitchenMarland Kitchenpt will call back to r/s

## 2013-07-23 ENCOUNTER — Telehealth: Payer: Self-pay | Admitting: Nurse Practitioner

## 2013-07-23 ENCOUNTER — Other Ambulatory Visit: Payer: Medicare Other

## 2013-07-23 ENCOUNTER — Ambulatory Visit: Payer: Medicare Other | Admitting: Hematology and Oncology

## 2013-07-23 NOTE — Telephone Encounter (Signed)
NTBS.

## 2013-07-24 NOTE — Telephone Encounter (Signed)
Patient aware. Suggested OTC treatments. Avoid dairy, caffeine, fatty foods. Increase water intake. Diarrhea should improve around 3rd day. If not will NTBS.

## 2013-07-30 ENCOUNTER — Other Ambulatory Visit: Payer: Self-pay | Admitting: Nurse Practitioner

## 2013-08-25 ENCOUNTER — Telehealth: Payer: Self-pay | Admitting: Nurse Practitioner

## 2013-08-26 NOTE — Telephone Encounter (Signed)
Does he have to have B12 shot or can he take the pill because he said the shot its self cost too much to keep getting?

## 2013-08-26 NOTE — Telephone Encounter (Signed)
Patient aware.

## 2013-08-26 NOTE — Telephone Encounter (Signed)
The pill will  Not work that is why he is on shots.

## 2013-09-24 ENCOUNTER — Other Ambulatory Visit: Payer: Self-pay | Admitting: Nurse Practitioner

## 2013-09-30 ENCOUNTER — Ambulatory Visit (INDEPENDENT_AMBULATORY_CARE_PROVIDER_SITE_OTHER): Payer: Medicare HMO

## 2013-09-30 DIAGNOSIS — E538 Deficiency of other specified B group vitamins: Secondary | ICD-10-CM

## 2013-09-30 MED ORDER — CYANOCOBALAMIN 1000 MCG/ML IJ SOLN
1000.0000 ug | INTRAMUSCULAR | Status: AC
  Administered 2013-09-30 – 2016-08-29 (×18): 1000 ug via INTRAMUSCULAR

## 2013-10-06 ENCOUNTER — Other Ambulatory Visit: Payer: Self-pay | Admitting: Family Medicine

## 2013-10-06 ENCOUNTER — Telehealth: Payer: Self-pay | Admitting: Nurse Practitioner

## 2013-10-07 ENCOUNTER — Other Ambulatory Visit: Payer: Self-pay | Admitting: *Deleted

## 2013-10-07 MED ORDER — NIFEDIPINE ER OSMOTIC RELEASE 30 MG PO TB24: 30.0000 mg | ORAL_TABLET | Freq: Every day | ORAL | Status: DC

## 2013-10-07 NOTE — Telephone Encounter (Signed)
rx sent to pharmacy

## 2013-10-12 ENCOUNTER — Telehealth: Payer: Self-pay | Admitting: Nurse Practitioner

## 2013-10-21 ENCOUNTER — Other Ambulatory Visit: Payer: Self-pay | Admitting: *Deleted

## 2013-10-21 MED ORDER — PANTOPRAZOLE SODIUM 40 MG PO TBEC: DELAYED_RELEASE_TABLET | ORAL | Status: DC

## 2013-10-21 MED ORDER — SIMVASTATIN 40 MG PO TABS: ORAL_TABLET | ORAL | Status: DC

## 2013-10-21 MED ORDER — FENOFIBRATE 160 MG PO TABS: ORAL_TABLET | ORAL | Status: DC

## 2013-10-21 NOTE — Telephone Encounter (Signed)
Patient last seen in office on 03-20-13. Please advise.

## 2013-10-21 NOTE — Telephone Encounter (Signed)
Patient NTBS for follow up and lab work  

## 2013-10-21 NOTE — Telephone Encounter (Signed)
Patient aware.

## 2013-11-03 ENCOUNTER — Telehealth: Payer: Self-pay | Admitting: Nurse Practitioner

## 2013-11-03 NOTE — Telephone Encounter (Signed)
Patient was calling about his wife.

## 2013-11-04 ENCOUNTER — Ambulatory Visit (INDEPENDENT_AMBULATORY_CARE_PROVIDER_SITE_OTHER): Payer: Medicare HMO | Admitting: *Deleted

## 2013-11-04 DIAGNOSIS — E538 Deficiency of other specified B group vitamins: Secondary | ICD-10-CM

## 2013-11-13 ENCOUNTER — Other Ambulatory Visit: Payer: Self-pay | Admitting: Nurse Practitioner

## 2013-11-16 NOTE — Telephone Encounter (Signed)
Last seen 10/08/12  MMM

## 2013-11-16 NOTE — Telephone Encounter (Signed)
No more refills without being seen 

## 2013-11-18 ENCOUNTER — Other Ambulatory Visit: Payer: Self-pay | Admitting: Nurse Practitioner

## 2013-11-20 NOTE — Telephone Encounter (Signed)
Patient NTBS for follow up and lab work No more refills without being seen

## 2013-11-20 NOTE — Telephone Encounter (Signed)
No visit or labs since 03/2013 and wants 90 day supply sent to [facility]

## 2013-12-29 ENCOUNTER — Ambulatory Visit (INDEPENDENT_AMBULATORY_CARE_PROVIDER_SITE_OTHER): Payer: Medicare HMO | Admitting: *Deleted

## 2013-12-29 DIAGNOSIS — E538 Deficiency of other specified B group vitamins: Secondary | ICD-10-CM

## 2013-12-29 NOTE — Progress Notes (Signed)
Patient tolerated well.

## 2013-12-29 NOTE — Patient Instructions (Signed)
Vitamin B12 Injections Every person needs vitamin B12. A deficiency develops when the body does not get enough of it. One way to overcome this is by getting B12 shots (injections). A B12 shot puts the vitamin directly into muscle tissue. This avoids any problems your body might have in absorbing it from food or a pill. In some people, the body has trouble using the vitamin correctly. This can cause a B12 deficiency. Not consuming enough of the vitamin can also cause a deficiency. Getting enough vitamin B12 can be hard for elderly people. Sometimes, they do not eat a well-balanced diet. The elderly are also more likely than younger people to have medical conditions or take medications that can lead to a deficiency. WHAT DOES VITAMIN B12 DO? Vitamin B12 does many things to help the body work right:  It helps the body make healthy red blood cells.  It helps maintain nerve cells.  It is involved in the body's process of converting food into energy (metabolism).  It is needed to make the genetic material in all cells (DNA). VITAMIN B12 FOOD SOURCES Most people get plenty of vitamin B12 through the foods they eat. It is present in:  Meat, fish, poultry, and eggs.  Milk and milk products.  It also is added when certain foods are made, including some breads, cereals and yogurts. The food is then called "fortified". CAUSES The most common causes of vitamin B12 deficiency are:  Pernicious anemia. The condition develops when the body cannot make enough healthy red blood cells. This stems from a lack of a protein made in the stomach (intrinsic factor). People without this protein cannot absorb enough vitamin B12 from food.  Malabsorption. This is when the body cannot absorb the vitamin. It can be caused by:  Pernicious anemia.  Surgery to remove part or all of the stomach can lead to malabsorption. Removal of part or all of the small intestine can also cause malabsorption.  Vegetarian diet.  People who are strict about not eating foods from animals could have trouble taking in enough vitamin B12 from diet alone.  Medications. Some medicines have been linked to B12 deficiency, such as Metformin (a drug prescribed for type 2 diabetes). Long-term use of stomach acid suppressants also can keep the vitamin from being absorbed.  Intestinal problems such as inflammatory bowel disease. If there are problems in the digestive tract, vitamin B12 may not be absorbed in good enough amounts. SYMPTOMS People who do not get enough B12 can develop problems. These can include:  Anemia. This is when the body has too few red blood cells. Red blood cells carry oxygen to the rest of the body. Without a healthy supply of red blood cells, people can feel:  Tired (fatigued).  Weak.  Severe anemia can cause:  Shortness of breath.  Dizziness.  Rapid heart rate.  Paleness.  Other Vitamin B12 deficiency symptoms include:  Diarrhea.  Numbness or tingling in the hands or feet.  Loss of appetite.  Confusion.  Sores on the tongue or in the mouth. LET YOUR CAREGIVER KNOW ABOUT:  Any allergies. It is very important to know if you are allergic or sensitive to cobalt. Vitamin B12 contains cobalt.  Any history of kidney disease.  All medications you are taking. Include prescription and over-the-counter medicines, herbs and creams.  Whether you are pregnant or breast-feeding.  If you have Leber's disease, a hereditary eye condition, vitamin B12 could make it worse. RISKS AND COMPLICATIONS Reactions to an injection are   usually temporary. They might include:  Pain at the injection site.  Redness, swelling or tenderness at the site.  Headache, dizziness or weakness.  Nausea, upset stomach or diarrhea.  Numbness or tingling.  Fever.  Joint pain.  Itching or rash. If a reaction does not go away in a short while, talk with your healthcare provider. A change in the way the shots are  given, or where they are given, might need to be made. BEFORE AN INJECTION To decide whether B12 injections are right for you, your healthcare provider will probably:  Ask about your medical history.  Ask questions about your diet.  Ask about symptoms such as:  Have you felt weak?  Do you feel unusually tired?  Do you get dizzy?  Order blood tests. These may include a test to:  Check the level of red cells in your blood.  Measure B12 levels.  Check for the presence of intrinsic factor. VITAMIN B12 INJECTIONS How often you will need a vitamin B12 injection will depend on how severe your deficiency is. This also will affect how long you will need to get them. People with pernicious anemia usually get injections for their entire life. Others might get them for a shorter period. For many people, injections are given daily or weekly for several weeks. Then, once B12 levels are normal, injections are given just once a month. If the cause of the deficiency can be fixed, the injections can be stopped. Talk with your healthcare provider about what you should expect. For an injection:  The injection site will be cleaned with an alcohol swab.  Your healthcare provider will insert a needle directly into a muscle. Most any muscle can be used. Most often, an arm muscle is used. A buttocks muscle can also be used. Many people say shots in that area are less painful.  A small adhesive bandage may be put over the injection site. It usually can be taken off in an hour or less. Injections can be given by your healthcare provider. In some cases, family members give them. Sometimes, people give them to themselves. Talk with your healthcare provider about what would be best for you. If someone other than your healthcare provider will be giving the shots, the person will need to be trained to give them correctly. HOME CARE INSTRUCTIONS   You can remove the adhesive bandage within an hour of getting a  shot.  You should be able to go about your normal activities right away.  Avoid drinking large amounts of alcohol while taking vitamin B12 shots. Alcohol can interfere with the body's use of the vitamin. SEEK MEDICAL CARE IF:   Pain, redness, swelling or tenderness at the injection site does not get better or gets worse.  Headache, dizziness or weakness does not go away.  You develop a fever of more than 100.5 F (38.1 C). SEEK IMMEDIATE MEDICAL CARE IF:   You have chest pain.  You develop shortness of breath.  You have muscle weakness that gets worse.  You develop numbness, weakness or tingling on one side or one area of the body.  You have symptoms of an allergic reaction, such as:  Hives.  Difficulty breathing.  Swelling of the lips, face, tongue or throat.  You develop a fever of more than 102.0 F (38.9 C). MAKE SURE YOU:   Understand these instructions.  Will watch your condition.  Will get help right away if you are not doing well or get worse. Document   Released: 07/27/2008 Document Revised: 07/23/2011 Document Reviewed: 07/27/2008 [employer] Patient Information 2015 [employer]. This information is not intended to replace advice given to you by your health care provider. Make sure you discuss any questions you have with your health care provider.  

## 2014-01-13 ENCOUNTER — Other Ambulatory Visit: Payer: Self-pay | Admitting: Nurse Practitioner

## 2014-01-21 ENCOUNTER — Ambulatory Visit (INDEPENDENT_AMBULATORY_CARE_PROVIDER_SITE_OTHER): Payer: Medicare HMO

## 2014-01-21 ENCOUNTER — Ambulatory Visit (INDEPENDENT_AMBULATORY_CARE_PROVIDER_SITE_OTHER): Payer: Medicare HMO | Admitting: Nurse Practitioner

## 2014-01-21 ENCOUNTER — Encounter: Payer: Self-pay | Admitting: Nurse Practitioner

## 2014-01-21 VITALS — BP 137/68 | HR 57 | Temp 98.6°F | Ht 65.0 in | Wt 182.6 lb

## 2014-01-21 DIAGNOSIS — D72819 Decreased white blood cell count, unspecified: Secondary | ICD-10-CM

## 2014-01-21 DIAGNOSIS — I209 Angina pectoris, unspecified: Secondary | ICD-10-CM

## 2014-01-21 DIAGNOSIS — I25111 Atherosclerotic heart disease of native coronary artery with angina pectoris with documented spasm: Secondary | ICD-10-CM

## 2014-01-21 DIAGNOSIS — N400 Enlarged prostate without lower urinary tract symptoms: Secondary | ICD-10-CM | POA: Insufficient documentation

## 2014-01-21 DIAGNOSIS — J449 Chronic obstructive pulmonary disease, unspecified: Secondary | ICD-10-CM

## 2014-01-21 DIAGNOSIS — K219 Gastro-esophageal reflux disease without esophagitis: Secondary | ICD-10-CM

## 2014-01-21 DIAGNOSIS — I10 Essential (primary) hypertension: Secondary | ICD-10-CM

## 2014-01-21 DIAGNOSIS — E785 Hyperlipidemia, unspecified: Secondary | ICD-10-CM

## 2014-01-21 DIAGNOSIS — IMO0001 Reserved for inherently not codable concepts without codable children: Secondary | ICD-10-CM | POA: Insufficient documentation

## 2014-01-21 DIAGNOSIS — N189 Chronic kidney disease, unspecified: Secondary | ICD-10-CM

## 2014-01-21 DIAGNOSIS — I159 Secondary hypertension, unspecified: Secondary | ICD-10-CM

## 2014-01-21 DIAGNOSIS — E119 Type 2 diabetes mellitus without complications: Secondary | ICD-10-CM

## 2014-01-21 DIAGNOSIS — I158 Other secondary hypertension: Secondary | ICD-10-CM

## 2014-01-21 DIAGNOSIS — D500 Iron deficiency anemia secondary to blood loss (chronic): Secondary | ICD-10-CM

## 2014-01-21 DIAGNOSIS — I2510 Atherosclerotic heart disease of native coronary artery without angina pectoris: Secondary | ICD-10-CM

## 2014-01-21 LAB — POCT GLYCOSYLATED HEMOGLOBIN (HGB A1C): HEMOGLOBIN A1C: 6

## 2014-01-21 MED ORDER — TAMSULOSIN HCL 0.4 MG PO CAPS: ORAL_CAPSULE | ORAL | Status: DC

## 2014-01-21 MED ORDER — FERROUS SULFATE 325 (65 FE) MG PO TABS: 650.0000 mg | ORAL_TABLET | Freq: Two times a day (BID) | ORAL | Status: DC

## 2014-01-21 MED ORDER — LOSARTAN POTASSIUM-HCTZ 100-25 MG PO TABS: ORAL_TABLET | ORAL | Status: DC

## 2014-01-21 MED ORDER — METFORMIN HCL 500 MG PO TABS: ORAL_TABLET | ORAL | Status: DC

## 2014-01-21 MED ORDER — PANTOPRAZOLE SODIUM 40 MG PO TBEC: DELAYED_RELEASE_TABLET | ORAL | Status: DC

## 2014-01-21 MED ORDER — CLONIDINE HCL 0.3 MG PO TABS: ORAL_TABLET | ORAL | Status: DC

## 2014-01-21 MED ORDER — NIFEDIPINE ER OSMOTIC RELEASE 30 MG PO TB24: 30.0000 mg | ORAL_TABLET | Freq: Every day | ORAL | Status: DC

## 2014-01-21 MED ORDER — BUDESONIDE-FORMOTEROL FUMARATE 80-4.5 MCG/ACT IN AERO: 1.0000 | INHALATION_SPRAY | Freq: Two times a day (BID) | RESPIRATORY_TRACT | Status: DC

## 2014-01-21 MED ORDER — SIMVASTATIN 40 MG PO TABS: ORAL_TABLET | ORAL | Status: DC

## 2014-01-21 MED ORDER — FENOFIBRATE 160 MG PO TABS: ORAL_TABLET | ORAL | Status: DC

## 2014-01-21 NOTE — Progress Notes (Signed)
Subjective:    Patient ID: Collin Campbell, male    DOB: 1925/07/21, 80 y.o.   MRN: [id number]  Patient here today for follow up of chronic medical problems.  Hypertension This is a chronic problem. The current episode started more than 1 year ago. The problem is unchanged. The problem is uncontrolled. Pertinent negatives include no blurred vision, chest pain, malaise/fatigue, palpitations or peripheral edema. Risk factors for coronary artery disease include diabetes mellitus, dyslipidemia, family history and male gender. Past treatments include diuretics. The current treatment provides mild improvement. Compliance problems include diet.  There is no history of a thyroid problem. Identifiable causes of hypertension include sleep apnea.  Diabetes He has type 2 diabetes mellitus. His disease course has been stable. Pertinent negatives for hypoglycemia include no dizziness, seizures or speech difficulty. Associated symptoms include visual change. Pertinent negatives for diabetes include no blurred vision, no chest pain and no foot ulcerations. There are no hypoglycemic complications. Symptoms are stable. Risk factors for coronary artery disease include diabetes mellitus, dyslipidemia, family history and male sex. Current diabetic treatment includes oral agent (monotherapy). He is compliant with treatment most of the time. His weight is stable. He is following a generally healthy diet. He participates in exercise daily. There is no change in his home blood glucose trend. His breakfast blood glucose is taken between 8-9 am. His breakfast blood glucose range is generally 90-110 mg/dl. He does not see a podiatrist.Eye exam is not current (Pt states been "years" at least two or three).  Hyperlipidemia This is a chronic problem. The current episode started more than 1 year ago. The problem is controlled. Recent lipid tests were reviewed and are normal. Exacerbating diseases include diabetes. He has no history of  hypothyroidism. Pertinent negatives include no chest pain. Current antihyperlipidemic treatment includes exercise and statins. The current treatment provides mild improvement of lipids. There are no compliance problems.  Risk factors for coronary artery disease include diabetes mellitus, dyslipidemia, family history, male sex and hypertension.  B12 deficiency Patient wants to give himself shots at home- Needs rx Chronic kidney disease  No longer sees specialist- we are watching him and will refer back if needed Anemia Due to chronic blood loss- have not been able to determine source- takes iron supplements daily BPH Still take flomax- has some trouble emptying completely COPD symbicort BID- works well has not needed albulterol at all in over 3-4 months  Review of Systems  Constitutional: Negative for malaise/fatigue.  Eyes: Negative for blurred vision.  Respiratory: Positive for cough.   Cardiovascular: Negative for chest pain and palpitations.  Neurological: Negative for dizziness, seizures and speech difficulty.  All other systems reviewed and are negative.      Objective:   Physical Exam  Constitutional: He is oriented to person, place, and time. He appears well-developed and well-nourished.  HENT:  Head: Normocephalic.  Eyes: Pupils are equal, round, and reactive to light.  Neck: Normal range of motion. No JVD present. No thyromegaly present.  Cardiovascular: Normal rate, regular rhythm and normal heart sounds.   Pulmonary/Chest: Effort normal.  Diminished breath sounds   Abdominal: Soft. Bowel sounds are normal. He exhibits no mass. There is no tenderness. There is no rebound.  Musculoskeletal: Normal range of motion. He exhibits edema (trace amount on RL ankle).  Neurological: He is alert and oriented to person, place, and time.  Skin: Skin is warm and dry.    BP 137/68  Pulse 57  Temp(Src) 98.6 F (37 C) (Oral)  Ht _0  (1.651 m)  Wt 182 lb 9.6 oz (82.827 kg)  BMI  30.39 kg/m2 Results for orders placed in visit on 01/21/14  POCT GLYCOSYLATED HEMOGLOBIN (HGB A1C)      Result Value Ref Range   Hemoglobin A1C 6.0     EKG- sinus bradycardia-Mary-Margaret Hassell Done, FNP  Chest x ray- bronchitic changes otherwise normal-Preliminary reading by Ronnald Collum, FNP  [facility]     Assessment & Plan:   1. DM   2. Chronic kidney disease, unspecified stage   3. Hyperlipidemia   4. Secondary hypertension, unspecified   5. Iron deficiency anemia due to chronic blood loss   6. Leukopenia   7. BPH (benign prostatic hyperplasia)   8. COPD bronchitis   9. Essential hypertension, benign   10. Gastroesophageal reflux disease without esophagitis   11. Coronary artery disease involving native coronary artery of native heart with angina pectoris with documented spasm    Orders Placed This Encounter  Procedures  . DG Chest 2 View    Standing Status: Future     Number of Occurrences: 1     Standing Expiration Date: 03/23/2015    Order Specific Question:  Reason for Exam (SYMPTOM  OR DIAGNOSIS REQUIRED)    Answer:  COPD    Order Specific Question:  Preferred imaging location?    Answer:  Internal  . CMP14+EGFR  . NMR, lipoprofile  . PSA, total and free  . Anemia Profile B  . POCT glycosylated hemoglobin (Hb A1C)  . EKG 12-Lead   Meds ordered this encounter  Medications  . ferrous sulfate 325 (65 FE) MG tablet    Sig: Take 2 tablets (650 mg total) by mouth 2 (two) times daily with a meal.    Dispense:  90 tablet    Refill:  1    Order Specific Question:  Supervising Provider    Answer:  Chipper Herb [[id number]]  . tamsulosin (FLOMAX) 0.4 MG CAPS capsule    Sig: TAKE 1 CAPSULE EVERY DAY    Dispense:  90 capsule    Refill:  1    Order Specific Question:  Supervising Provider    Answer:  Chipper Herb [[id number]]  . simvastatin (ZOCOR) 40 MG tablet    Sig: TAKE 1 TABLET (40 MG TOTAL) BY MOUTH AT BEDTIME.    Dispense:  90 tablet    Refill:  1    Order Specific  Question:  Supervising Provider    Answer:  Chipper Herb [[id number]]  . pantoprazole (PROTONIX) 40 MG tablet    Sig: TAKE 1 TABLET (40 MG TOTAL) BY MOUTH DAILY WITH BREAKFAST.    Dispense:  90 tablet    Refill:  1    Order Specific Question:  Supervising Provider    Answer:  Chipper Herb [[id number]]  . NIFEdipine (PROCARDIA-XL/ADALAT-CC/NIFEDICAL-XL) 30 MG 24 hr tablet    Sig: Take 1 tablet (30 mg total) by mouth daily.    Dispense:  90 tablet    Refill:  1    Order Specific Question:  Supervising Provider    Answer:  Chipper Herb [[id number]]  . metFORMIN (GLUCOPHAGE) 500 MG tablet    Sig: TAKE 1 TABLET TWICE DAILY    Dispense:  180 tablet    Refill:  1    Order Specific Question:  Supervising Provider    Answer:  Chipper Herb [[id number]]  . losartan-hydrochlorothiazide (HYZAAR) 100-25 MG per tablet    Sig: TAKE 1 TABLET EVERY DAY  WITH BREAKFAST    Dispense:  90 tablet    Refill:  1    Order Specific Question:  Supervising Provider    Answer:  Chipper Herb [[id number]]  . fenofibrate 160 MG tablet    Sig: TAKE 1 TABLET (160 MG TOTAL) BY MOUTH DAILY.    Dispense:  90 tablet    Refill:  0    Order Specific Question:  Supervising Provider    Answer:  Chipper Herb [[id number]]  . cloNIDine (CATAPRES) 0.3 MG tablet    Sig: TAKE 1 TABLET TWICE DAILY    Dispense:  180 tablet    Refill:  1    Order Specific Question:  Supervising Provider    Answer:  Chipper Herb [[id number]]  . budesonide-formoterol (SYMBICORT) 80-4.5 MCG/ACT inhaler    Sig: Inhale 1 puff into the lungs 2 (two) times daily.    Dispense:  1 Inhaler    Refill:  5    Order Specific Question:  Supervising Provider    Answer:  Joycelyn Man    Discussed weight management for patient with BMI> 25 Labs pending Health maintenance reviewed Diet and exercise encouraged Continue all meds Follow up  In 3 month   [place], FNP

## 2014-01-21 NOTE — Patient Instructions (Signed)

## 2014-01-22 LAB — ANEMIA PROFILE B
BASOS ABS: 0 10*3/uL (ref 0.0–0.2)
Basos: 0 %
EOS ABS: 0.1 10*3/uL (ref 0.0–0.4)
Eos: 2 %
FERRITIN: 418 ng/mL — AB (ref 30–400)
Folate: >19.9 ng/mL (ref >3.0)
HCT: 29.8 % — ABNORMAL LOW (ref 37.5–51.0)
Hemoglobin: 9.9 g/dL — ABNORMAL LOW (ref 12.6–17.7)
IRON: 63 ug/dL (ref 40–155)
Immature Grans (Abs): 0 10*3/uL (ref 0.0–0.1)
Immature Granulocytes: 0 %
Iron Saturation: 23 % (ref 15–55)
LYMPHS: 20 %
Lymphocytes Absolute: 0.9 10*3/uL (ref 0.7–3.1)
MCH: 32.4 pg (ref 26.6–33.0)
MCHC: 33.2 g/dL (ref 31.5–35.7)
MCV: 97 fL (ref 79–97)
Monocytes Absolute: 0.8 10*3/uL (ref 0.1–0.9)
Monocytes: 19 %
NEUTROS ABS: 2.6 10*3/uL (ref 1.4–7.0)
Neutrophils Relative %: 59 %
PLATELETS: 343 10*3/uL (ref 150–379)
RBC: 3.06 x10E6/uL — ABNORMAL LOW (ref 4.14–5.80)
RDW: 13.5 % (ref 12.3–15.4)
RETIC CT PCT: 1.6 % (ref 0.6–2.6)
TIBC: 274 ug/dL (ref 250–450)
UIBC: 211 ug/dL (ref 150–375)
Vitamin B-12: 383 pg/mL (ref 211–946)
WBC: 4.4 10*3/uL (ref 3.4–10.8)

## 2014-01-22 LAB — CMP14+EGFR
A/G RATIO: 1.4 (ref 1.1–2.5)
ALBUMIN: 3.6 g/dL (ref 3.5–4.7)
ALT: 10 IU/L (ref 0–44)
AST: 24 IU/L (ref 0–40)
Alkaline Phosphatase: 38 IU/L — ABNORMAL LOW (ref 39–117)
BUN/Creatinine Ratio: 12 (ref 10–22)
BUN: 15 mg/dL (ref 8–27)
CALCIUM: 9.8 mg/dL (ref 8.6–10.2)
CO2: 23 mmol/L (ref 18–29)
Chloride: 100 mmol/L (ref 97–108)
Creatinine, Ser: 1.26 mg/dL (ref 0.76–1.27)
GFR calc Af Amer: 60 mL/min/{1.73_m2} (ref >59)
GFR calc non Af Amer: 52 mL/min/{1.73_m2} — ABNORMAL LOW (ref >59)
GLOBULIN, TOTAL: 2.5 g/dL (ref 1.5–4.5)
Glucose: 112 mg/dL — ABNORMAL HIGH (ref 65–99)
Potassium: 4.9 mmol/L (ref 3.5–5.2)
Sodium: 139 mmol/L (ref 134–144)
TOTAL PROTEIN: 6.1 g/dL (ref 6.0–8.5)
Total Bilirubin: 0.4 mg/dL (ref 0.0–1.2)

## 2014-01-22 LAB — NMR, LIPOPROFILE
CHOLESTEROL: 118 mg/dL (ref 100–199)
HDL Cholesterol by NMR: 40 mg/dL (ref >39)
HDL Particle Number: 37.6 umol/L (ref >=30.5)
LDL Particle Number: 970 nmol/L (ref <1000)
LDL SIZE: 20.7 nm (ref >20.5)
LDLC SERPL CALC-MCNC: 56 mg/dL (ref 0–99)
LP-IR Score: 70 — ABNORMAL HIGH (ref <=45)
Small LDL Particle Number: 649 nmol/L — ABNORMAL HIGH (ref <=527)
TRIGLYCERIDES BY NMR: 108 mg/dL (ref 0–149)

## 2014-01-22 LAB — PSA, TOTAL AND FREE
PSA, Free Pct: >50 %
PSA, Free: 0.05 ng/mL
PSA: <0.1 ng/mL (ref 0.0–4.0)

## 2014-02-01 ENCOUNTER — Ambulatory Visit (INDEPENDENT_AMBULATORY_CARE_PROVIDER_SITE_OTHER): Payer: Medicare HMO

## 2014-02-01 DIAGNOSIS — E538 Deficiency of other specified B group vitamins: Secondary | ICD-10-CM

## 2014-02-26 ENCOUNTER — Other Ambulatory Visit: Payer: Self-pay

## 2014-03-03 ENCOUNTER — Ambulatory Visit (INDEPENDENT_AMBULATORY_CARE_PROVIDER_SITE_OTHER): Payer: Medicare HMO | Admitting: *Deleted

## 2014-03-03 ENCOUNTER — Ambulatory Visit (INDEPENDENT_AMBULATORY_CARE_PROVIDER_SITE_OTHER): Payer: Medicare HMO

## 2014-03-03 DIAGNOSIS — Z23 Encounter for immunization: Secondary | ICD-10-CM

## 2014-03-03 DIAGNOSIS — E538 Deficiency of other specified B group vitamins: Secondary | ICD-10-CM

## 2014-03-03 NOTE — Patient Instructions (Signed)
Vitamin B12 Injections Every person needs vitamin B12. A deficiency develops when the body does not get enough of it. One way to overcome this is by getting B12 shots (injections). A B12 shot puts the vitamin directly into muscle tissue. This avoids any problems your body might have in absorbing it from food or a pill. In some people, the body has trouble using the vitamin correctly. This can cause a B12 deficiency. Not consuming enough of the vitamin can also cause a deficiency. Getting enough vitamin B12 can be hard for elderly people. Sometimes, they do not eat a well-balanced diet. The elderly are also more likely than younger people to have medical conditions or take medications that can lead to a deficiency. WHAT DOES VITAMIN B12 DO? Vitamin B12 does many things to help the body work right:  It helps the body make healthy red blood cells.  It helps maintain nerve cells.  It is involved in the body's process of converting food into energy (metabolism).  It is needed to make the genetic material in all cells (DNA). VITAMIN B12 FOOD SOURCES Most people get plenty of vitamin B12 through the foods they eat. It is present in:  Meat, fish, poultry, and eggs.  Milk and milk products.  It also is added when certain foods are made, including some breads, cereals and yogurts. The food is then called "fortified". CAUSES The most common causes of vitamin B12 deficiency are:  Pernicious anemia. The condition develops when the body cannot make enough healthy red blood cells. This stems from a lack of a protein made in the stomach (intrinsic factor). People without this protein cannot absorb enough vitamin B12 from food.  Malabsorption. This is when the body cannot absorb the vitamin. It can be caused by:  Pernicious anemia.  Surgery to remove part or all of the stomach can lead to malabsorption. Removal of part or all of the small intestine can also cause malabsorption.  Vegetarian diet.  People who are strict about not eating foods from animals could have trouble taking in enough vitamin B12 from diet alone.  Medications. Some medicines have been linked to B12 deficiency, such as Metformin (a drug prescribed for type 2 diabetes). Long-term use of stomach acid suppressants also can keep the vitamin from being absorbed.  Intestinal problems such as inflammatory bowel disease. If there are problems in the digestive tract, vitamin B12 may not be absorbed in good enough amounts. SYMPTOMS People who do not get enough B12 can develop problems. These can include:  Anemia. This is when the body has too few red blood cells. Red blood cells carry oxygen to the rest of the body. Without a healthy supply of red blood cells, people can feel:  Tired (fatigued).  Weak.  Severe anemia can cause:  Shortness of breath.  Dizziness.  Rapid heart rate.  Paleness.  Other Vitamin B12 deficiency symptoms include:  Diarrhea.  Numbness or tingling in the hands or feet.  Loss of appetite.  Confusion.  Sores on the tongue or in the mouth. LET YOUR CAREGIVER KNOW ABOUT:  Any allergies. It is very important to know if you are allergic or sensitive to cobalt. Vitamin B12 contains cobalt.  Any history of kidney disease.  All medications you are taking. Include prescription and over-the-counter medicines, herbs and creams.  Whether you are pregnant or breast-feeding.  If you have Leber's disease, a hereditary eye condition, vitamin B12 could make it worse. RISKS AND COMPLICATIONS Reactions to an injection are   usually temporary. They might include:  Pain at the injection site.  Redness, swelling or tenderness at the site.  Headache, dizziness or weakness.  Nausea, upset stomach or diarrhea.  Numbness or tingling.  Fever.  Joint pain.  Itching or rash. If a reaction does not go away in a short while, talk with your healthcare provider. A change in the way the shots are  given, or where they are given, might need to be made. BEFORE AN INJECTION To decide whether B12 injections are right for you, your healthcare provider will probably:  Ask about your medical history.  Ask questions about your diet.  Ask about symptoms such as:  Have you felt weak?  Do you feel unusually tired?  Do you get dizzy?  Order blood tests. These may include a test to:  Check the level of red cells in your blood.  Measure B12 levels.  Check for the presence of intrinsic factor. VITAMIN B12 INJECTIONS How often you will need a vitamin B12 injection will depend on how severe your deficiency is. This also will affect how long you will need to get them. People with pernicious anemia usually get injections for their entire life. Others might get them for a shorter period. For many people, injections are given daily or weekly for several weeks. Then, once B12 levels are normal, injections are given just once a month. If the cause of the deficiency can be fixed, the injections can be stopped. Talk with your healthcare provider about what you should expect. For an injection:  The injection site will be cleaned with an alcohol swab.  Your healthcare provider will insert a needle directly into a muscle. Most any muscle can be used. Most often, an arm muscle is used. A buttocks muscle can also be used. Many people say shots in that area are less painful.  A small adhesive bandage may be put over the injection site. It usually can be taken off in an hour or less. Injections can be given by your healthcare provider. In some cases, family members give them. Sometimes, people give them to themselves. Talk with your healthcare provider about what would be best for you. If someone other than your healthcare provider will be giving the shots, the person will need to be trained to give them correctly. HOME CARE INSTRUCTIONS   You can remove the adhesive bandage within an hour of getting a  shot.  You should be able to go about your normal activities right away.  Avoid drinking large amounts of alcohol while taking vitamin B12 shots. Alcohol can interfere with the body's use of the vitamin. SEEK MEDICAL CARE IF:   Pain, redness, swelling or tenderness at the injection site does not get better or gets worse.  Headache, dizziness or weakness does not go away.  You develop a fever of more than 100.5 F (38.1 C). SEEK IMMEDIATE MEDICAL CARE IF:   You have chest pain.  You develop shortness of breath.  You have muscle weakness that gets worse.  You develop numbness, weakness or tingling on one side or one area of the body.  You have symptoms of an allergic reaction, such as:  Hives.  Difficulty breathing.  Swelling of the lips, face, tongue or throat.  You develop a fever of more than 102.0 F (38.9 C). MAKE SURE YOU:   Understand these instructions.  Will watch your condition.  Will get help right away if you are not doing well or get worse. Document   Released: 07/27/2008 Document Revised: 07/23/2011 Document Reviewed: 07/27/2008 [employer] Patient Information 2015 [employer]. This information is not intended to replace advice given to you by your health care provider. Make sure you discuss any questions you have with your health care provider.  

## 2014-03-03 NOTE — Progress Notes (Signed)
Patient ID: Collin Campbell, male   DOB: 1925-10-16, 80 y.o.   MRN: [id number] Vitamin b12 given and tolerated well.

## 2014-04-05 ENCOUNTER — Other Ambulatory Visit: Payer: Self-pay | Admitting: Nurse Practitioner

## 2014-04-06 NOTE — Telephone Encounter (Signed)
Refill request from mail order for #180. If directions are correct would need #360 for 3 month supply.

## 2014-05-06 ENCOUNTER — Ambulatory Visit (INDEPENDENT_AMBULATORY_CARE_PROVIDER_SITE_OTHER): Payer: Medicare HMO | Admitting: *Deleted

## 2014-05-06 DIAGNOSIS — E538 Deficiency of other specified B group vitamins: Secondary | ICD-10-CM

## 2014-05-06 NOTE — Patient Instructions (Signed)

## 2014-05-06 NOTE — Progress Notes (Signed)
Pt given B12 injection IM right deltoid, pt tolerated injection well. 

## 2014-05-27 ENCOUNTER — Emergency Department (HOSPITAL_COMMUNITY): Payer: No Typology Code available for payment source

## 2014-05-27 ENCOUNTER — Encounter (HOSPITAL_COMMUNITY): Payer: Self-pay | Admitting: Emergency Medicine

## 2014-05-27 ENCOUNTER — Emergency Department (HOSPITAL_COMMUNITY)
Admission: EM | Admit: 2014-05-27 | Discharge: 2014-05-27 | Disposition: A | Discharge status: Home / Self Care | Payer: No Typology Code available for payment source | Attending: Emergency Medicine | Admitting: Emergency Medicine

## 2014-05-27 DIAGNOSIS — S29001A Unspecified injury of muscle and tendon of front wall of thorax, initial encounter: Secondary | ICD-10-CM | POA: Insufficient documentation

## 2014-05-27 DIAGNOSIS — E780 Pure hypercholesterolemia: Secondary | ICD-10-CM | POA: Diagnosis not present

## 2014-05-27 DIAGNOSIS — M1990 Unspecified osteoarthritis, unspecified site: Secondary | ICD-10-CM | POA: Diagnosis not present

## 2014-05-27 DIAGNOSIS — K219 Gastro-esophageal reflux disease without esophagitis: Secondary | ICD-10-CM | POA: Insufficient documentation

## 2014-05-27 DIAGNOSIS — Y998 Other external cause status: Secondary | ICD-10-CM | POA: Diagnosis not present

## 2014-05-27 DIAGNOSIS — T1490XA Injury, unspecified, initial encounter: Secondary | ICD-10-CM

## 2014-05-27 DIAGNOSIS — E119 Type 2 diabetes mellitus without complications: Secondary | ICD-10-CM | POA: Insufficient documentation

## 2014-05-27 DIAGNOSIS — D649 Anemia, unspecified: Secondary | ICD-10-CM | POA: Insufficient documentation

## 2014-05-27 DIAGNOSIS — S51811A Laceration without foreign body of right forearm, initial encounter: Secondary | ICD-10-CM | POA: Insufficient documentation

## 2014-05-27 DIAGNOSIS — Z8701 Personal history of pneumonia (recurrent): Secondary | ICD-10-CM | POA: Insufficient documentation

## 2014-05-27 DIAGNOSIS — Y9389 Activity, other specified: Secondary | ICD-10-CM | POA: Diagnosis not present

## 2014-05-27 DIAGNOSIS — S199XXA Unspecified injury of neck, initial encounter: Secondary | ICD-10-CM | POA: Diagnosis present

## 2014-05-27 DIAGNOSIS — N189 Chronic kidney disease, unspecified: Secondary | ICD-10-CM | POA: Diagnosis not present

## 2014-05-27 DIAGNOSIS — I2510 Atherosclerotic heart disease of native coronary artery without angina pectoris: Secondary | ICD-10-CM | POA: Insufficient documentation

## 2014-05-27 DIAGNOSIS — Z7951 Long term (current) use of inhaled steroids: Secondary | ICD-10-CM | POA: Insufficient documentation

## 2014-05-27 DIAGNOSIS — Z9861 Coronary angioplasty status: Secondary | ICD-10-CM | POA: Insufficient documentation

## 2014-05-27 DIAGNOSIS — E539 Vitamin B deficiency, unspecified: Secondary | ICD-10-CM | POA: Diagnosis not present

## 2014-05-27 DIAGNOSIS — T148XXA Other injury of unspecified body region, initial encounter: Secondary | ICD-10-CM

## 2014-05-27 DIAGNOSIS — Y92410 Unspecified street and highway as the place of occurrence of the external cause: Secondary | ICD-10-CM | POA: Insufficient documentation

## 2014-05-27 DIAGNOSIS — Z87438 Personal history of other diseases of male genital organs: Secondary | ICD-10-CM | POA: Insufficient documentation

## 2014-05-27 DIAGNOSIS — Z8669 Personal history of other diseases of the nervous system and sense organs: Secondary | ICD-10-CM | POA: Insufficient documentation

## 2014-05-27 DIAGNOSIS — Z8711 Personal history of peptic ulcer disease: Secondary | ICD-10-CM | POA: Insufficient documentation

## 2014-05-27 DIAGNOSIS — Z23 Encounter for immunization: Secondary | ICD-10-CM | POA: Insufficient documentation

## 2014-05-27 DIAGNOSIS — S51812A Laceration without foreign body of left forearm, initial encounter: Secondary | ICD-10-CM | POA: Insufficient documentation

## 2014-05-27 DIAGNOSIS — Z7982 Long term (current) use of aspirin: Secondary | ICD-10-CM | POA: Insufficient documentation

## 2014-05-27 DIAGNOSIS — S129XXA Fracture of neck, unspecified, initial encounter: Secondary | ICD-10-CM

## 2014-05-27 DIAGNOSIS — Z79899 Other long term (current) drug therapy: Secondary | ICD-10-CM | POA: Insufficient documentation

## 2014-05-27 DIAGNOSIS — S61511A Laceration without foreign body of right wrist, initial encounter: Secondary | ICD-10-CM | POA: Diagnosis not present

## 2014-05-27 DIAGNOSIS — Z87891 Personal history of nicotine dependence: Secondary | ICD-10-CM | POA: Diagnosis not present

## 2014-05-27 DIAGNOSIS — I129 Hypertensive chronic kidney disease with stage 1 through stage 4 chronic kidney disease, or unspecified chronic kidney disease: Secondary | ICD-10-CM | POA: Insufficient documentation

## 2014-05-27 DIAGNOSIS — J449 Chronic obstructive pulmonary disease, unspecified: Secondary | ICD-10-CM | POA: Diagnosis not present

## 2014-05-27 LAB — COMPREHENSIVE METABOLIC PANEL
ALT: 16 U/L (ref 0–53)
AST: 36 U/L (ref 0–37)
Albumin: 3.7 g/dL (ref 3.5–5.2)
Alkaline Phosphatase: 44 U/L (ref 39–117)
Anion gap: 8 (ref 5–15)
BILIRUBIN TOTAL: 0.4 mg/dL (ref 0.3–1.2)
BUN: 13 mg/dL (ref 6–23)
CO2: 27 mmol/L (ref 19–32)
Calcium: 9.6 mg/dL (ref 8.4–10.5)
Chloride: 105 mEq/L (ref 96–112)
Creatinine, Ser: 1.21 mg/dL (ref 0.50–1.35)
GFR calc non Af Amer: 52 mL/min — ABNORMAL LOW (ref >90)
GFR, EST AFRICAN AMERICAN: 61 mL/min — AB (ref >90)
GLUCOSE: 111 mg/dL — AB (ref 70–99)
Potassium: 4.7 mmol/L (ref 3.5–5.1)
SODIUM: 140 mmol/L (ref 135–145)
TOTAL PROTEIN: 6.5 g/dL (ref 6.0–8.3)

## 2014-05-27 LAB — CBC WITH DIFFERENTIAL/PLATELET
BASOS PCT: 0 % (ref 0–1)
Basophils Absolute: 0 10*3/uL (ref 0.0–0.1)
EOS PCT: 3 % (ref 0–5)
Eosinophils Absolute: 0.1 10*3/uL (ref 0.0–0.7)
HCT: 32.8 % — ABNORMAL LOW (ref 39.0–52.0)
Hemoglobin: 10.5 g/dL — ABNORMAL LOW (ref 13.0–17.0)
LYMPHS PCT: 24 % (ref 12–46)
Lymphs Abs: 0.7 10*3/uL (ref 0.7–4.0)
MCH: 31.5 pg (ref 26.0–34.0)
MCHC: 32 g/dL (ref 30.0–36.0)
MCV: 98.5 fL (ref 78.0–100.0)
MONOS PCT: 17 % — AB (ref 3–12)
Monocytes Absolute: 0.5 10*3/uL (ref 0.1–1.0)
Neutro Abs: 1.7 10*3/uL (ref 1.7–7.7)
Neutrophils Relative %: 56 % (ref 43–77)
Platelets: 263 10*3/uL (ref 150–400)
RBC: 3.33 MIL/uL — ABNORMAL LOW (ref 4.22–5.81)
RDW: 13.7 % (ref 11.5–15.5)
WBC: 3.1 10*3/uL — AB (ref 4.0–10.5)

## 2014-05-27 MED ORDER — TRAMADOL HCL 50 MG PO TABS: 50.0000 mg | ORAL_TABLET | Freq: Four times a day (QID) | ORAL | Status: DC | PRN

## 2014-05-27 MED ORDER — IOHEXOL 300 MG/ML  SOLN
100.0000 mL | Freq: Once | INTRAMUSCULAR | Status: AC | PRN
  Administered 2014-05-27: 100 mL via INTRAVENOUS

## 2014-05-27 MED ORDER — NAPROXEN 500 MG PO TABS: 500.0000 mg | ORAL_TABLET | Freq: Two times a day (BID) | ORAL | Status: AC

## 2014-05-27 MED ORDER — TRAMADOL HCL 50 MG PO TABS
50.0000 mg | ORAL_TABLET | Freq: Once | ORAL | Status: AC
  Administered 2014-05-27: 50 mg via ORAL
  Filled 2014-05-27: qty 1

## 2014-05-27 MED ORDER — TETANUS-DIPHTH-ACELL PERTUSSIS 5-2.5-18.5 LF-MCG/0.5 IM SUSP
0.5000 mL | Freq: Once | INTRAMUSCULAR | Status: AC
  Administered 2014-05-27: 0.5 mL via INTRAMUSCULAR
  Filled 2014-05-27: qty 0.5
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
## 2014-05-27 NOTE — Progress Notes (Signed)
Orthopedic Tech Progress Note Patient Details:  Collin Campbell [birth year] [id number]  Ortho Devices Type of Ortho Device: Soft collar Ortho Device/Splint Location: neck Ortho Device/Splint Interventions: Ordered, Application   Braulio Bosch 05/27/2014, 10:39 PM

## 2014-05-27 NOTE — ED Notes (Signed)
Patient transported to CT 
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
## 2014-05-27 NOTE — ED Notes (Signed)
Patient returned from CT

## 2014-05-27 NOTE — ED Notes (Signed)
Pt is in radiology department.

## 2014-05-27 NOTE — Discharge Instructions (Signed)
Keep your wounds clean and dry Clean them in warm soapy water once daily Use a topical antibiotic cream daily Change dressing once daily with a clean dressing. See your doctor in 2 days for recheck  Please call your doctor for a followup appointment within 24-48 hours. When you talk to your doctor please let them know that you were seen in the emergency department and have them acquire all of your records so that they can discuss the findings with you and formulate a treatment plan to fully care for your new and ongoing problems.

## 2014-05-27 NOTE — ED Notes (Signed)
This RN unable to gain IV access.

## 2014-05-27 NOTE — ED Notes (Signed)
Pt has returned from radiology.  

## 2014-06-03 ENCOUNTER — Ambulatory Visit (INDEPENDENT_AMBULATORY_CARE_PROVIDER_SITE_OTHER): Payer: Medicare Other | Admitting: Nurse Practitioner

## 2014-06-03 ENCOUNTER — Encounter: Payer: Self-pay | Admitting: Nurse Practitioner

## 2014-06-03 DIAGNOSIS — S129XXD Fracture of neck, unspecified, subsequent encounter: Secondary | ICD-10-CM
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
## 2014-06-10 ENCOUNTER — Ambulatory Visit (INDEPENDENT_AMBULATORY_CARE_PROVIDER_SITE_OTHER): Payer: Medicare Other | Admitting: *Deleted

## 2014-06-10 DIAGNOSIS — E538 Deficiency of other specified B group vitamins: Secondary | ICD-10-CM

## 2014-06-10 NOTE — Patient Instructions (Signed)

## 2014-06-10 NOTE — Progress Notes (Signed)
Vitamin b12 injection given and tolerated well.  

## 2014-06-15 ENCOUNTER — Telehealth: Payer: Self-pay | Admitting: Nurse Practitioner

## 2014-06-15 MED ORDER — AZITHROMYCIN 250 MG PO TABS: ORAL_TABLET | ORAL | Status: DC

## 2014-06-15 NOTE — Telephone Encounter (Signed)
Pt aware rx sent to the pharmacy. 

## 2014-06-22 ENCOUNTER — Other Ambulatory Visit: Payer: Self-pay | Admitting: Nurse Practitioner

## 2014-06-22 DIAGNOSIS — IMO0001 Reserved for inherently not codable concepts without codable children: Secondary | ICD-10-CM

## 2014-06-24 ENCOUNTER — Other Ambulatory Visit: Payer: Self-pay

## 2014-06-24 MED ORDER — ALBUTEROL SULFATE HFA 108 (90 BASE) MCG/ACT IN AERS: 1.0000 | INHALATION_SPRAY | Freq: Four times a day (QID) | RESPIRATORY_TRACT | Status: AC | PRN

## 2014-06-30 NOTE — Telephone Encounter (Signed)
He and his wife have called today, but they didn't give to me.

## 2014-06-30 NOTE — Telephone Encounter (Signed)
[place]o is checking to see if MMM has done prescription  for symbicort thru [employer] assistance program.  Call [place] today to let him know if this is [phone]

## 2014-07-01 ENCOUNTER — Telehealth: Payer: Self-pay | Admitting: Nurse Practitioner

## 2014-07-01 MED ORDER — BUDESONIDE-FORMOTEROL FUMARATE 80-4.5 MCG/ACT IN AERO: 1.0000 | INHALATION_SPRAY | Freq: Two times a day (BID) | RESPIRATORY_TRACT | Status: DC

## 2014-07-01 NOTE — Telephone Encounter (Signed)
done

## 2014-07-01 NOTE — Telephone Encounter (Signed)
Papers are being filled out by our home health nurse for Collin Campbell. Wife states that she will contact the company she gets her meds from and have them send paperwork for her.

## 2014-07-01 NOTE — Telephone Encounter (Signed)
symbicort rx sent to pharmacy

## 2014-07-01 NOTE — Telephone Encounter (Signed)
Stp advised not to take naproxen per MMM due to his kidneys. Pt voiced understanding.

## 2014-07-02 ENCOUNTER — Other Ambulatory Visit: Payer: Self-pay | Admitting: *Deleted

## 2014-07-02 DIAGNOSIS — K219 Gastro-esophageal reflux disease without esophagitis: Secondary | ICD-10-CM

## 2014-07-02 DIAGNOSIS — E119 Type 2 diabetes mellitus without complications: Secondary | ICD-10-CM

## 2014-07-02 DIAGNOSIS — N400 Enlarged prostate without lower urinary tract symptoms: Secondary | ICD-10-CM

## 2014-07-02 DIAGNOSIS — E785 Hyperlipidemia, unspecified: Secondary | ICD-10-CM

## 2014-07-02 DIAGNOSIS — I25111 Atherosclerotic heart disease of native coronary artery with angina pectoris with documented spasm: Secondary | ICD-10-CM

## 2014-07-02 DIAGNOSIS — I10 Essential (primary) hypertension: Secondary | ICD-10-CM

## 2014-07-02 MED ORDER — SIMVASTATIN 40 MG PO TABS: ORAL_TABLET | ORAL | Status: DC

## 2014-07-02 MED ORDER — LOSARTAN POTASSIUM-HCTZ 100-25 MG PO TABS: ORAL_TABLET | ORAL | Status: DC

## 2014-07-02 MED ORDER — PANTOPRAZOLE SODIUM 40 MG PO TBEC: DELAYED_RELEASE_TABLET | ORAL | Status: DC

## 2014-07-02 MED ORDER — CLONIDINE HCL 0.3 MG PO TABS: ORAL_TABLET | ORAL | Status: DC

## 2014-07-02 MED ORDER — FENOFIBRATE 160 MG PO TABS: ORAL_TABLET | ORAL | Status: DC

## 2014-07-02 MED ORDER — FERROUS SULFATE 325 (65 FE) MG PO TABS: ORAL_TABLET | ORAL | Status: DC

## 2014-07-02 MED ORDER — TAMSULOSIN HCL 0.4 MG PO CAPS: ORAL_CAPSULE | ORAL | Status: DC

## 2014-07-02 MED ORDER — METFORMIN HCL 500 MG PO TABS: ORAL_TABLET | ORAL | Status: DC

## 2014-07-02 MED ORDER — NIFEDIPINE ER OSMOTIC RELEASE 30 MG PO TB24: 30.0000 mg | ORAL_TABLET | Freq: Every day | ORAL | Status: DC

## 2014-07-02 NOTE — Telephone Encounter (Signed)
Order 1 mo supply of fenofibrate at [facility] and order his Ferrous Sulfate always at [facility], because [facility] doesn't have

## 2014-07-21 ENCOUNTER — Encounter: Payer: Self-pay | Admitting: Nurse Practitioner

## 2014-07-21 ENCOUNTER — Ambulatory Visit (INDEPENDENT_AMBULATORY_CARE_PROVIDER_SITE_OTHER): Payer: Medicare Other

## 2014-07-21 ENCOUNTER — Ambulatory Visit (INDEPENDENT_AMBULATORY_CARE_PROVIDER_SITE_OTHER): Payer: Medicare Other | Admitting: Nurse Practitioner

## 2014-07-21 VITALS — BP 137/82 | HR 58 | Temp 97.5°F | Ht 65.0 in | Wt 188.0 lb

## 2014-07-21 DIAGNOSIS — S129XXD Fracture of neck, unspecified, subsequent encounter: Secondary | ICD-10-CM

## 2014-07-21 DIAGNOSIS — S129XXS Fracture of neck, unspecified, sequela: Secondary | ICD-10-CM
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
## 2014-08-12 ENCOUNTER — Ambulatory Visit (INDEPENDENT_AMBULATORY_CARE_PROVIDER_SITE_OTHER): Payer: Medicare Other | Admitting: *Deleted

## 2014-08-12 DIAGNOSIS — E538 Deficiency of other specified B group vitamins: Secondary | ICD-10-CM | POA: Diagnosis not present

## 2014-08-12 NOTE — Progress Notes (Signed)
Pt given B12 injection IM right deltoid and tolerated well. 

## 2014-08-12 NOTE — Patient Instructions (Signed)

## 2014-08-26 DIAGNOSIS — Z6830 Body mass index (BMI) 30.0-30.9, adult: Secondary | ICD-10-CM | POA: Diagnosis not present

## 2014-08-26 DIAGNOSIS — M5021 Other cervical disc displacement,  high cervical region: Secondary | ICD-10-CM | POA: Diagnosis not present

## 2014-08-26 DIAGNOSIS — I10 Essential (primary) hypertension: Secondary | ICD-10-CM | POA: Diagnosis not present

## 2014-08-26 DIAGNOSIS — M549 Dorsalgia, unspecified: Secondary | ICD-10-CM | POA: Diagnosis not present

## 2014-08-26 DIAGNOSIS — M542 Cervicalgia: Secondary | ICD-10-CM | POA: Diagnosis not present

## 2014-09-16 ENCOUNTER — Other Ambulatory Visit: Payer: Self-pay | Admitting: Neurosurgery

## 2014-09-16 DIAGNOSIS — M5021 Other cervical disc displacement,  high cervical region: Secondary | ICD-10-CM

## 2014-09-20 ENCOUNTER — Ambulatory Visit: Payer: Medicare Other | Admitting: Family

## 2014-09-20 ENCOUNTER — Encounter: Payer: Self-pay | Admitting: Family

## 2014-09-20 ENCOUNTER — Ambulatory Visit (INDEPENDENT_AMBULATORY_CARE_PROVIDER_SITE_OTHER): Payer: Medicare Other | Admitting: Family

## 2014-09-20 VITALS — Temp 97.3°F | Ht 65.0 in | Wt 186.0 lb

## 2014-09-20 DIAGNOSIS — L551 Sunburn of second degree: Secondary | ICD-10-CM | POA: Diagnosis not present

## 2014-09-20 MED ORDER — METHYLPREDNISOLONE ACETATE 80 MG/ML IJ SUSP
80.0000 mg | Freq: Once | INTRAMUSCULAR | Status: AC
  Administered 2014-09-20: 80 mg via INTRAMUSCULAR

## 2014-09-20 MED ORDER — HYDROCORTISONE ACE-PRAMOXINE 2.5-1 % EX CREA
TOPICAL_CREAM | CUTANEOUS | Status: AC
Start: 2014-09-20 — End: ?

## 2014-09-20 MED ORDER — ALUMINUM ACETATE EX CREA: 1.0000 "application " | TOPICAL_CREAM | Freq: Two times a day (BID) | CUTANEOUS | Status: DC

## 2014-09-20 NOTE — Patient Instructions (Signed)
Sunburn Sunburn is damage to the skin caused by overexposure to ultraviolet (UV) rays. People with light skin or a fair complexion may be more susceptible to sunburn. Repeated sun exposure causes early skin aging such as wrinkles and sun spots. It also increases the risk of skin cancer. CAUSES A sunburn is caused by getting too much UV radiation from the sun. SYMPTOMS  Red or pink skin.  Soreness and swelling.  Pain.  Blisters.  Peeling skin.  Headache, fever, and fatigue if sunburn covers a large area. TREATMENT  Your caregiver may tell you to take certain medicines to lessen inflammation.  Your caregiver may have you use hydrocortisone cream or spray to help with itching and inflammation.  Your caregiver may prescribe an antibiotic cream to use on blisters. HOME CARE INSTRUCTIONS   Avoid further exposure to the sun.  Cool baths and cool compresses may be helpful if used several times per day. Do not apply ice, since this may result in more damage to the skin.  Only take over-the-counter or prescription medicines for pain, discomfort, or fever as directed by your caregiver.  Use aloe or other over-the-counter sunburn creams or gels on your skin. Do not apply these creams or gels on blisters.  Drink enough fluids to keep your urine clear or pale yellow.  Do not break blisters. If blisters break, your caregiver may recommend an antibiotic cream to apply to the affected area. PREVENTION   Try to avoid the sun between 10:00 a.m. and 4:00 p.m. when it is the strongest.  Apply sunscreen at least 30 minutes before exposure to the sun.  Always wear protective hats, clothing, and sunglasses with UV protection.  Avoid medicines, herbs, and foods that increase your sensitivity to sunlight.  Avoid tanning beds. SEEK IMMEDIATE MEDICAL CARE IF:   You have a fever.  Your pain is uncontrolled with medicine.  You start to vomit or have diarrhea.  You feel faint or develop a  headache with confusion.  You develop severe blistering.  You have a pus-like (purulent) discharge coming from the blisters.  Your burn becomes more painful and swollen. MAKE SURE YOU:  Understand these instructions.  Will watch your condition.  Will get help right away if you are not doing well or get worse. Document Released: 02/07/2005 Document Revised: 08/25/2012 Document Reviewed: 10/22/2010 ExitCare Patient Information 2015 [employer]. This information is not intended to replace advice given to you by your health care provider. Make sure you discuss any questions you have with your health care provider.  

## 2014-09-20 NOTE — Progress Notes (Signed)
   Subjective:    Patient ID: Collin Campbell, male    DOB: 1925/10/17, 81 y.o.   MRN: [id number]  HPI Pt presents to the office today with bilateral arm redness and left arm swelling that started last week. Pt states he was outside in his yard doing yard work last week and then his arms became red. Pt states he has tried OTC lotion and creams with no relief. Pt states his burning, itching pain is a 6 out of 10.   Review of Systems  Constitutional: Negative.   HENT: Negative.   Respiratory: Negative.   Cardiovascular: Negative.   Gastrointestinal: Negative.   Endocrine: Negative.   Genitourinary: Negative.   Musculoskeletal: Negative.   Neurological: Negative.   Hematological: Negative.   Psychiatric/Behavioral: Negative.   All other systems reviewed and are negative.      Objective:   Physical Exam  Constitutional: He is oriented to person, place, and time. He appears well-developed and well-nourished. No distress.  HENT:  Head: Normocephalic.  Eyes: Pupils are equal, round, and reactive to light. Right eye exhibits no discharge. Left eye exhibits no discharge.  Neck: Normal range of motion. Neck supple. No thyromegaly present.  Cardiovascular: Normal rate, regular rhythm, normal heart sounds and intact distal pulses.   No murmur heard. Pulmonary/Chest: Effort normal and breath sounds normal. No respiratory distress. He has no wheezes.  Abdominal: Soft. Bowel sounds are normal. He exhibits no distension. There is no tenderness.  Musculoskeletal: Normal range of motion. He exhibits edema (2 + in left arm). He exhibits no tenderness.  Neurological: He is alert and oriented to person, place, and time. He has normal reflexes. No cranial nerve deficit.  Skin: Skin is warm and dry. Rash noted. There is erythema.  Bilateral arms from wrist to elbow are red blistered  Psychiatric: He has a normal mood and affect. His behavior is normal. Judgment and thought content normal.  Vitals  reviewed.   Temp(Src) 97.3 F (36.3 C) (Oral)  Ht 5\' 5"  (1.651 m)  Wt 186 lb (84.369 kg)  BMI 30.95 kg/m2       Assessment & Plan:  1. Sunburn, blistering -Wear protective wear while outside -Apply sunscreen at least 30 mins before exposure to sun -Minimize being outside from 10:00 am to 4:00 pm  - aluminum acetate (ACID MANTLE) CREA; Apply 1 application topically 2 (two) times daily.  Dispense: 120 g; Refill: 0 - Pramoxine-HC (HYDROCORTISONE ACE-PRAMOXINE) 2.5-1 % CREA; Use on bilateral arms TID  Dispense: 57 g; Refill: 1 - methylPREDNISolone acetate (DEPO-MEDROL) injection 80 mg; Inject 1 mL (80 mg total) into the muscle once.  Evelina Dun, FNP

## 2014-09-21 ENCOUNTER — Other Ambulatory Visit: Payer: Self-pay | Admitting: Family

## 2014-09-21 NOTE — Telephone Encounter (Signed)
Spoke to [place] at the pharmacy and they state they were able to figure it out. No other advice needed.

## 2014-09-21 NOTE — Telephone Encounter (Signed)
Tell pt  To get PRAMOXINE OTC

## 2014-09-23 ENCOUNTER — Other Ambulatory Visit: Payer: Medicare Other

## 2014-09-29 ENCOUNTER — Ambulatory Visit
Admission: RE | Admit: 2014-09-29 | Discharge: 2014-09-29 | Disposition: A | Discharge status: Home / Self Care | Payer: Medicare Other | Source: Ambulatory Visit | Attending: Neurosurgery | Admitting: Neurosurgery

## 2014-09-29 DIAGNOSIS — M5032 Other cervical disc degeneration, mid-cervical region: Secondary | ICD-10-CM | POA: Diagnosis not present

## 2014-09-29 DIAGNOSIS — M4802 Spinal stenosis, cervical region: Secondary | ICD-10-CM | POA: Diagnosis not present

## 2014-09-29 DIAGNOSIS — M4312 Spondylolisthesis, cervical region: Secondary | ICD-10-CM | POA: Diagnosis not present

## 2014-09-29 DIAGNOSIS — M5021 Other cervical disc displacement,  high cervical region: Secondary | ICD-10-CM

## 2014-10-14 DIAGNOSIS — M5021 Other cervical disc displacement,  high cervical region: Secondary | ICD-10-CM | POA: Diagnosis not present

## 2014-10-15 ENCOUNTER — Ambulatory Visit (INDEPENDENT_AMBULATORY_CARE_PROVIDER_SITE_OTHER): Payer: Medicare Other | Admitting: Nurse Practitioner

## 2014-10-15 ENCOUNTER — Encounter: Payer: Self-pay | Admitting: Nurse Practitioner

## 2014-10-15 VITALS — BP 147/74 | HR 53 | Temp 97.7°F | Ht 65.0 in | Wt 182.0 lb

## 2014-10-15 DIAGNOSIS — Z9189 Other specified personal risk factors, not elsewhere classified: Secondary | ICD-10-CM | POA: Diagnosis not present

## 2014-10-15 DIAGNOSIS — X32XXXA Exposure to sunlight, initial encounter: Secondary | ICD-10-CM

## 2014-10-15 MED ORDER — METHYLPREDNISOLONE ACETATE 80 MG/ML IJ SUSP
80.0000 mg | Freq: Once | INTRAMUSCULAR | Status: AC
  Administered 2014-10-15: 80 mg via INTRAMUSCULAR

## 2014-10-15 MED ORDER — PREDNISONE 20 MG PO TABS: ORAL_TABLET | ORAL | Status: DC

## 2014-10-15 NOTE — Patient Instructions (Signed)

## 2014-10-15 NOTE — Progress Notes (Signed)
   Subjective:    Patient ID: Collin Campbell, male    DOB: 1925/12/22, 81 y.o.   MRN: [id number]  HPI Patient was doing yard work outside 3 weeks ago and got to much sun. Has not gotten any better- his arms and trunk are affected- itching and tender to touch.    Review of Systems  Constitutional: Negative.   HENT: Negative.   Respiratory: Negative.   Cardiovascular: Negative.   Genitourinary: Negative.   Neurological: Negative.   Psychiatric/Behavioral: Negative.   All other systems reviewed and are negative.      Objective:   Physical Exam  Constitutional: He appears well-developed and well-nourished.  Cardiovascular: Normal rate and normal heart sounds.   Pulmonary/Chest: Effort normal and breath sounds normal.  Skin:  Erythematous skin bil forearms and abdomen with vesicles scattered throughout.  Psychiatric: He has a normal mood and affect. His behavior is normal. Judgment and thought content normal.   BP 147/74 mmHg  Pulse 53  Temp(Src) 97.7 F (36.5 C) (Oral)  Ht 5\' 5"  (1.651 m)  Wt 182 lb (82.555 kg)  BMI 30.29 kg/m2     Assessment & Plan:  1. Excess sun exposure/ contact dermatitis Wear sunscreen in the future Watch blood sugars because for increase. - methylPREDNISolone acetate (DEPO-MEDROL) injection 80 mg; Inject 1 mL (80 mg total) into the muscle once. - predniSONE (DELTASONE) 20 MG tablet; 2 po at sametime daily for 5 days- start tomorrow  Dispense: 10 tablet; Refill: 0 RTO prn   Mary-Margaret Hassell Done, FNP

## 2014-10-18 ENCOUNTER — Other Ambulatory Visit: Payer: Self-pay | Admitting: Nurse Practitioner

## 2014-10-18 NOTE — Telephone Encounter (Signed)
Last seen 10/15/14 MMM  Last labs 01/21/14

## 2014-11-02 ENCOUNTER — Encounter: Payer: Self-pay | Admitting: Nurse Practitioner

## 2014-11-02 ENCOUNTER — Ambulatory Visit (INDEPENDENT_AMBULATORY_CARE_PROVIDER_SITE_OTHER): Payer: Medicare Other | Admitting: Nurse Practitioner

## 2014-11-02 VITALS — BP 142/63 | HR 91 | Temp 98.1°F | Ht 65.0 in | Wt 182.0 lb

## 2014-11-02 DIAGNOSIS — L309 Dermatitis, unspecified: Secondary | ICD-10-CM

## 2014-11-02 MED ORDER — METHYLPREDNISOLONE ACETATE 80 MG/ML IJ SUSP
80.0000 mg | Freq: Once | INTRAMUSCULAR | Status: AC
  Administered 2014-11-02: 80 mg via INTRAMUSCULAR

## 2014-11-02 NOTE — Progress Notes (Signed)
   Subjective:    Patient ID: Collin Campbell, male    DOB: Jul 24, 1925, 81 y.o.   MRN: [id number]  HPI Patient in again c/o rash on bil forarms- has been seen 2 x for this and has had 2 steroid shots. He says that the rash itches and burns.    Review of Systems  Constitutional: Negative.   HENT: Negative.   Respiratory: Negative.   Gastrointestinal: Negative.   Genitourinary: Negative.   Neurological: Negative.   Psychiatric/Behavioral: Negative.   All other systems reviewed and are negative.      Objective:   Physical Exam  Constitutional: He appears well-developed and well-nourished.  Pulmonary/Chest: Effort normal and breath sounds normal.  Skin: Rash (macular appearing) noted. There is erythema (bil upper ext).  Psychiatric: He has a normal mood and affect. His behavior is normal. Judgment and thought content normal.    BP 142/63 mmHg  Pulse 91  Temp(Src) 98.1 F (36.7 C) (Oral)  Ht 5\' 5"  (1.572 m)  Wt 182 lb (82.555 kg)  BMI 30.29 kg/m2       Assessment & Plan:   1. Dermatitis    Meds ordered this encounter  Medications  . methylPREDNISolone acetate (DEPO-MEDROL) injection 80 mg    Sig:    Orders Placed This Encounter  Procedures  . Ambulatory referral to Dermatology    Referral Priority:  Routine    Referral Type:  Consultation    Referral Reason:  Specialty Services Required    Referred to Provider:  Lavonna Monarch, MD    Requested Specialty:  Dermatology    Number of Visits Requested:  1   Avoid scratching RTO prn  Mary-Margaret Hassell Done, FNP

## 2014-11-08 ENCOUNTER — Other Ambulatory Visit: Payer: Self-pay

## 2014-11-22 ENCOUNTER — Telehealth: Payer: Self-pay | Admitting: Nurse Practitioner

## 2014-11-22 NOTE — Telephone Encounter (Signed)
Appt made for tomorrow for B12

## 2014-11-23 ENCOUNTER — Ambulatory Visit (INDEPENDENT_AMBULATORY_CARE_PROVIDER_SITE_OTHER): Payer: Medicare Other | Admitting: *Deleted

## 2014-11-23 DIAGNOSIS — E538 Deficiency of other specified B group vitamins: Secondary | ICD-10-CM

## 2014-11-23 NOTE — Patient Instructions (Signed)

## 2014-11-23 NOTE — Progress Notes (Signed)
Patient was given vitamin b12 injection in left deltoid and tolerated well.

## 2014-12-01 DIAGNOSIS — L239 Allergic contact dermatitis, unspecified cause: Secondary | ICD-10-CM | POA: Diagnosis not present

## 2014-12-01 DIAGNOSIS — L237 Allergic contact dermatitis due to plants, except food: Secondary | ICD-10-CM | POA: Diagnosis not present

## 2014-12-01 DIAGNOSIS — L309 Dermatitis, unspecified: Secondary | ICD-10-CM | POA: Diagnosis not present

## 2014-12-01 DIAGNOSIS — L432 Lichenoid drug reaction: Secondary | ICD-10-CM | POA: Diagnosis not present

## 2014-12-01 DIAGNOSIS — Z79899 Other long term (current) drug therapy: Secondary | ICD-10-CM | POA: Diagnosis not present

## 2014-12-03 ENCOUNTER — Telehealth: Payer: Self-pay | Admitting: Hematology and Oncology

## 2014-12-03 NOTE — Telephone Encounter (Signed)
Faxed records to [facility] dermatology. Fax number: [phone]

## 2014-12-15 DIAGNOSIS — L309 Dermatitis, unspecified: Secondary | ICD-10-CM | POA: Diagnosis not present

## 2014-12-22 DIAGNOSIS — M9901 Segmental and somatic dysfunction of cervical region: Secondary | ICD-10-CM | POA: Diagnosis not present

## 2014-12-22 DIAGNOSIS — M5030 Other cervical disc degeneration, unspecified cervical region: Secondary | ICD-10-CM | POA: Diagnosis not present

## 2014-12-22 DIAGNOSIS — M9904 Segmental and somatic dysfunction of sacral region: Secondary | ICD-10-CM | POA: Diagnosis not present

## 2014-12-22 DIAGNOSIS — M9902 Segmental and somatic dysfunction of thoracic region: Secondary | ICD-10-CM | POA: Diagnosis not present

## 2014-12-22 DIAGNOSIS — M9903 Segmental and somatic dysfunction of lumbar region: Secondary | ICD-10-CM | POA: Diagnosis not present

## 2014-12-24 ENCOUNTER — Ambulatory Visit (INDEPENDENT_AMBULATORY_CARE_PROVIDER_SITE_OTHER): Payer: Medicare Other | Admitting: *Deleted

## 2014-12-24 DIAGNOSIS — E538 Deficiency of other specified B group vitamins: Secondary | ICD-10-CM | POA: Diagnosis not present

## 2014-12-24 NOTE — Patient Instructions (Signed)

## 2014-12-30 DIAGNOSIS — M9901 Segmental and somatic dysfunction of cervical region: Secondary | ICD-10-CM | POA: Diagnosis not present

## 2014-12-30 DIAGNOSIS — M9902 Segmental and somatic dysfunction of thoracic region: Secondary | ICD-10-CM | POA: Diagnosis not present

## 2014-12-30 DIAGNOSIS — M5030 Other cervical disc degeneration, unspecified cervical region: Secondary | ICD-10-CM | POA: Diagnosis not present

## 2014-12-30 DIAGNOSIS — M9903 Segmental and somatic dysfunction of lumbar region: Secondary | ICD-10-CM | POA: Diagnosis not present

## 2014-12-30 DIAGNOSIS — M9904 Segmental and somatic dysfunction of sacral region: Secondary | ICD-10-CM | POA: Diagnosis not present

## 2015-01-06 DIAGNOSIS — M9904 Segmental and somatic dysfunction of sacral region: Secondary | ICD-10-CM | POA: Diagnosis not present

## 2015-01-06 DIAGNOSIS — M9901 Segmental and somatic dysfunction of cervical region: Secondary | ICD-10-CM | POA: Diagnosis not present

## 2015-01-06 DIAGNOSIS — M9903 Segmental and somatic dysfunction of lumbar region: Secondary | ICD-10-CM | POA: Diagnosis not present

## 2015-01-06 DIAGNOSIS — M5030 Other cervical disc degeneration, unspecified cervical region: Secondary | ICD-10-CM | POA: Diagnosis not present

## 2015-01-06 DIAGNOSIS — M9902 Segmental and somatic dysfunction of thoracic region: Secondary | ICD-10-CM | POA: Diagnosis not present

## 2015-01-12 DIAGNOSIS — M9903 Segmental and somatic dysfunction of lumbar region: Secondary | ICD-10-CM | POA: Diagnosis not present

## 2015-01-12 DIAGNOSIS — M9902 Segmental and somatic dysfunction of thoracic region: Secondary | ICD-10-CM | POA: Diagnosis not present

## 2015-01-12 DIAGNOSIS — M9901 Segmental and somatic dysfunction of cervical region: Secondary | ICD-10-CM | POA: Diagnosis not present

## 2015-01-12 DIAGNOSIS — M9904 Segmental and somatic dysfunction of sacral region: Secondary | ICD-10-CM | POA: Diagnosis not present

## 2015-01-12 DIAGNOSIS — M5030 Other cervical disc degeneration, unspecified cervical region: Secondary | ICD-10-CM | POA: Diagnosis not present

## 2015-01-13 ENCOUNTER — Other Ambulatory Visit: Payer: Self-pay | Admitting: Nurse Practitioner

## 2015-01-13 NOTE — Telephone Encounter (Signed)
I dont see where we have ever filled this

## 2015-01-13 NOTE — Telephone Encounter (Signed)
Patient will contact Dr. Kathrine Haddock to ask for a refill on pain medications.  He goes to chiropractor on a regular basis to help relieve his neck pain . If things do not improve he may schedule an appointment to come in to see a provider.

## 2015-01-13 NOTE — Telephone Encounter (Signed)
ntbs for z pak

## 2015-01-13 NOTE — Telephone Encounter (Signed)
We do not fill his pain meds- will have to contact who ever has written rx in the past

## 2015-01-19 DIAGNOSIS — M9902 Segmental and somatic dysfunction of thoracic region: Secondary | ICD-10-CM | POA: Diagnosis not present

## 2015-01-19 DIAGNOSIS — M9904 Segmental and somatic dysfunction of sacral region: Secondary | ICD-10-CM | POA: Diagnosis not present

## 2015-01-19 DIAGNOSIS — M9903 Segmental and somatic dysfunction of lumbar region: Secondary | ICD-10-CM | POA: Diagnosis not present

## 2015-01-19 DIAGNOSIS — M5030 Other cervical disc degeneration, unspecified cervical region: Secondary | ICD-10-CM | POA: Diagnosis not present

## 2015-01-19 DIAGNOSIS — M9901 Segmental and somatic dysfunction of cervical region: Secondary | ICD-10-CM | POA: Diagnosis not present

## 2015-01-26 DIAGNOSIS — M9902 Segmental and somatic dysfunction of thoracic region: Secondary | ICD-10-CM | POA: Diagnosis not present

## 2015-01-26 DIAGNOSIS — M9903 Segmental and somatic dysfunction of lumbar region: Secondary | ICD-10-CM | POA: Diagnosis not present

## 2015-01-26 DIAGNOSIS — M5030 Other cervical disc degeneration, unspecified cervical region: Secondary | ICD-10-CM | POA: Diagnosis not present

## 2015-01-26 DIAGNOSIS — M9901 Segmental and somatic dysfunction of cervical region: Secondary | ICD-10-CM | POA: Diagnosis not present

## 2015-01-26 DIAGNOSIS — M9904 Segmental and somatic dysfunction of sacral region: Secondary | ICD-10-CM | POA: Diagnosis not present

## 2015-02-02 DIAGNOSIS — M9903 Segmental and somatic dysfunction of lumbar region: Secondary | ICD-10-CM | POA: Diagnosis not present

## 2015-02-02 DIAGNOSIS — M5030 Other cervical disc degeneration, unspecified cervical region: Secondary | ICD-10-CM | POA: Diagnosis not present

## 2015-02-02 DIAGNOSIS — M9901 Segmental and somatic dysfunction of cervical region: Secondary | ICD-10-CM | POA: Diagnosis not present

## 2015-02-02 DIAGNOSIS — M9904 Segmental and somatic dysfunction of sacral region: Secondary | ICD-10-CM | POA: Diagnosis not present

## 2015-02-02 DIAGNOSIS — M9902 Segmental and somatic dysfunction of thoracic region: Secondary | ICD-10-CM | POA: Diagnosis not present

## 2015-02-09 DIAGNOSIS — M9903 Segmental and somatic dysfunction of lumbar region: Secondary | ICD-10-CM | POA: Diagnosis not present

## 2015-02-09 DIAGNOSIS — M5030 Other cervical disc degeneration, unspecified cervical region: Secondary | ICD-10-CM | POA: Diagnosis not present

## 2015-02-09 DIAGNOSIS — M9902 Segmental and somatic dysfunction of thoracic region: Secondary | ICD-10-CM | POA: Diagnosis not present

## 2015-02-09 DIAGNOSIS — M9904 Segmental and somatic dysfunction of sacral region: Secondary | ICD-10-CM | POA: Diagnosis not present

## 2015-02-09 DIAGNOSIS — M9901 Segmental and somatic dysfunction of cervical region: Secondary | ICD-10-CM | POA: Diagnosis not present

## 2015-02-16 DIAGNOSIS — M9904 Segmental and somatic dysfunction of sacral region: Secondary | ICD-10-CM | POA: Diagnosis not present

## 2015-02-16 DIAGNOSIS — M9902 Segmental and somatic dysfunction of thoracic region: Secondary | ICD-10-CM | POA: Diagnosis not present

## 2015-02-16 DIAGNOSIS — M9903 Segmental and somatic dysfunction of lumbar region: Secondary | ICD-10-CM | POA: Diagnosis not present

## 2015-02-16 DIAGNOSIS — M9901 Segmental and somatic dysfunction of cervical region: Secondary | ICD-10-CM | POA: Diagnosis not present

## 2015-02-16 DIAGNOSIS — M5030 Other cervical disc degeneration, unspecified cervical region: Secondary | ICD-10-CM | POA: Diagnosis not present

## 2015-02-18 ENCOUNTER — Ambulatory Visit (INDEPENDENT_AMBULATORY_CARE_PROVIDER_SITE_OTHER): Payer: Medicare Other

## 2015-02-18 DIAGNOSIS — E538 Deficiency of other specified B group vitamins: Secondary | ICD-10-CM

## 2015-02-18 DIAGNOSIS — Z23 Encounter for immunization: Secondary | ICD-10-CM | POA: Diagnosis not present

## 2015-02-18 NOTE — Addendum Note (Signed)
Addended by: Rolena Infante on: 02/18/2015 05:09 PM   Modules accepted: Orders

## 2015-02-22 ENCOUNTER — Encounter: Payer: Self-pay | Admitting: Nurse Practitioner

## 2015-02-22 ENCOUNTER — Ambulatory Visit (INDEPENDENT_AMBULATORY_CARE_PROVIDER_SITE_OTHER): Payer: Medicare Other | Admitting: Nurse Practitioner

## 2015-02-22 VITALS — BP 134/77 | HR 65 | Temp 98.4°F | Ht 65.0 in | Wt 182.0 lb

## 2015-02-22 DIAGNOSIS — M542 Cervicalgia: Secondary | ICD-10-CM | POA: Diagnosis not present

## 2015-02-22 DIAGNOSIS — Z6830 Body mass index (BMI) 30.0-30.9, adult: Secondary | ICD-10-CM

## 2015-02-22 DIAGNOSIS — E1159 Type 2 diabetes mellitus with other circulatory complications: Secondary | ICD-10-CM | POA: Diagnosis not present

## 2015-02-22 DIAGNOSIS — E785 Hyperlipidemia, unspecified: Secondary | ICD-10-CM

## 2015-02-22 DIAGNOSIS — I10 Essential (primary) hypertension: Secondary | ICD-10-CM | POA: Diagnosis not present

## 2015-02-22 DIAGNOSIS — N189 Chronic kidney disease, unspecified: Secondary | ICD-10-CM

## 2015-02-22 DIAGNOSIS — J449 Chronic obstructive pulmonary disease, unspecified: Secondary | ICD-10-CM

## 2015-02-22 DIAGNOSIS — IMO0001 Reserved for inherently not codable concepts without codable children: Secondary | ICD-10-CM

## 2015-02-22 LAB — POCT UA - MICROALBUMIN: Microalbumin Ur, POC: NEGATIVE mg/L

## 2015-02-22 LAB — POCT GLYCOSYLATED HEMOGLOBIN (HGB A1C): HEMOGLOBIN A1C: 6.5

## 2015-02-22 MED ORDER — HYDROCODONE-ACETAMINOPHEN 5-325 MG PO TABS: 1.0000 | ORAL_TABLET | Freq: Four times a day (QID) | ORAL | Status: DC | PRN

## 2015-02-22 MED ORDER — FENOFIBRATE 160 MG PO TABS: ORAL_TABLET | ORAL | Status: DC

## 2015-02-22 MED ORDER — METFORMIN HCL 500 MG PO TABS: ORAL_TABLET | ORAL | Status: DC

## 2015-02-22 MED ORDER — SIMVASTATIN 40 MG PO TABS: ORAL_TABLET | ORAL | Status: DC

## 2015-02-22 NOTE — Addendum Note (Signed)
Addended by: Chevis Pretty on: 02/22/2015 05:11 PM   Modules accepted: Orders, Medications

## 2015-02-22 NOTE — Patient Instructions (Signed)

## 2015-02-22 NOTE — Progress Notes (Signed)
  Subjective:    Patient ID: Collin Campbell, male    DOB: 1926-04-26, 81 y.o.   MRN: [id number]  Patient here today for follow up of chronic medical problems.  Hypertension Pertinent negatives include no chest pain or palpitations.  Diabetes Pertinent negatives for hypoglycemia include no dizziness, seizures or speech difficulty. Associated symptoms include visual change. Pertinent negatives for diabetes include no chest pain.  Hyperlipidemia Pertinent negatives include no chest pain.  B12 deficiency Patient wants to give himself shots at home- Needs rx Chronic kidney disease  No longer sees specialist- we are watching him and will refer back if needed Anemia Due to chronic blood loss- have not been able to determine source- takes iron supplements daily BPH Still take flomax- has some trouble emptying completely COPD symbicort BID- works well has not needed albulterol at all in over 3-4 months  Review of Systems  Cardiovascular: Negative for chest pain and palpitations.  Neurological: Negative for dizziness, seizures and speech difficulty.  All other systems reviewed and are negative.      Objective:   Physical Exam  Constitutional: He is oriented to person, place, and time. He appears well-developed and well-nourished.  HENT:  Head: Normocephalic.  Eyes: Pupils are equal, round, and reactive to light.  Neck: Normal range of motion. No JVD present. No thyromegaly present.  Cardiovascular: Normal rate, regular rhythm and normal heart sounds.   Pulmonary/Chest: Effort normal.  Diminished breath sounds   Abdominal: Soft. Bowel sounds are normal. He exhibits no mass. There is no tenderness. There is no rebound.  Musculoskeletal: Normal range of motion. He exhibits edema (trace amount on RL ankle).  Neurological: He is alert and oriented to person, place, and time.  Skin: Skin is warm and dry.    BP 134/77 mmHg  Pulse 65  Temp(Src) 98.4 F (36.9 C) (Oral)  Ht $R'5\' 5"'aL$   (1.651 m)  Wt 182 lb (82.555 kg)  BMI 30.29 kg/m2 Results for orders placed or performed in visit on 02/22/15  POCT glycosylated hemoglobin (Hb A1C)  Result Value Ref Range   Hemoglobin A1C 6.5   POCT UA - Microalbumin  Result Value Ref Range   Microalbumin Ur, POC negative mg/L       Assessment & Plan:   1. Hyperlipidemia Low fat diet - Lipid panel - simvastatin (ZOCOR) 40 MG tablet; Take 1 tablet by mouth at  bedtime  Dispense: 90 tablet; Refill: 1 - fenofibrate 160 MG tablet; Take 1 tablet by mouth  daily  Dispense: 90 tablet; Refill: 1  2. Essential hypertension, benign No salt added - CMP14+EGFR  3. COPD bronchitis Continue to use inhalers as prescribed  4. 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 Chronic kidney disease, unspecified stage   6. BMI 30.0-30.9,adult Continue with diet and exercise    Labs pending Health maintenance reviewed Diet and exercise encouraged Continue all meds Follow up  In 3 months   [place], FNP

## 2015-02-23 DIAGNOSIS — M9903 Segmental and somatic dysfunction of lumbar region: Secondary | ICD-10-CM | POA: Diagnosis not present

## 2015-02-23 DIAGNOSIS — M9904 Segmental and somatic dysfunction of sacral region: Secondary | ICD-10-CM | POA: Diagnosis not present

## 2015-02-23 DIAGNOSIS — M9902 Segmental and somatic dysfunction of thoracic region: Secondary | ICD-10-CM | POA: Diagnosis not present

## 2015-02-23 DIAGNOSIS — M5030 Other cervical disc degeneration, unspecified cervical region: Secondary | ICD-10-CM | POA: Diagnosis not present

## 2015-02-23 DIAGNOSIS — M9901 Segmental and somatic dysfunction of cervical region: Secondary | ICD-10-CM | POA: Diagnosis not present

## 2015-02-23 LAB — CMP14+EGFR
A/G RATIO: 1.5 (ref 1.1–2.5)
ALK PHOS: 46 IU/L (ref 39–117)
ALT: 13 IU/L (ref 0–44)
AST: 29 IU/L (ref 0–40)
Albumin: 3.9 g/dL (ref 3.5–4.7)
BILIRUBIN TOTAL: 0.3 mg/dL (ref 0.0–1.2)
BUN/Creatinine Ratio: 10 (ref 10–22)
BUN: 13 mg/dL (ref 8–27)
CHLORIDE: 98 mmol/L (ref 97–108)
CO2: 28 mmol/L (ref 18–29)
Calcium: 9.5 mg/dL (ref 8.6–10.2)
Creatinine, Ser: 1.3 mg/dL — ABNORMAL HIGH (ref 0.76–1.27)
GFR calc non Af Amer: 49 mL/min/{1.73_m2} — ABNORMAL LOW (ref >59)
GFR, EST AFRICAN AMERICAN: 57 mL/min/{1.73_m2} — AB (ref >59)
GLUCOSE: 70 mg/dL (ref 65–99)
Globulin, Total: 2.6 g/dL (ref 1.5–4.5)
POTASSIUM: 4.2 mmol/L (ref 3.5–5.2)
Sodium: 139 mmol/L (ref 134–144)
Total Protein: 6.5 g/dL (ref 6.0–8.5)

## 2015-02-23 LAB — LIPID PANEL
CHOLESTEROL TOTAL: 114 mg/dL (ref 100–199)
Chol/HDL Ratio: 2.4 ratio units (ref 0.0–5.0)
HDL: 48 mg/dL (ref >39)
LDL Calculated: 41 mg/dL (ref 0–99)
Triglycerides: 127 mg/dL (ref 0–149)
VLDL CHOLESTEROL CAL: 25 mg/dL (ref 5–40)

## 2015-02-25 ENCOUNTER — Telehealth: Payer: Self-pay | Admitting: Nurse Practitioner

## 2015-02-25 NOTE — Telephone Encounter (Signed)
Stp and advised pt he has to take the rx to the pharmacy to have them file the insurance and then if it requires prior auth the pharmacy will contact our office. Pt voiced understanding.

## 2015-03-02 DIAGNOSIS — M9903 Segmental and somatic dysfunction of lumbar region: Secondary | ICD-10-CM | POA: Diagnosis not present

## 2015-03-02 DIAGNOSIS — M9901 Segmental and somatic dysfunction of cervical region: Secondary | ICD-10-CM | POA: Diagnosis not present

## 2015-03-02 DIAGNOSIS — M9904 Segmental and somatic dysfunction of sacral region: Secondary | ICD-10-CM | POA: Diagnosis not present

## 2015-03-02 DIAGNOSIS — M5030 Other cervical disc degeneration, unspecified cervical region: Secondary | ICD-10-CM | POA: Diagnosis not present

## 2015-03-02 DIAGNOSIS — M9902 Segmental and somatic dysfunction of thoracic region: Secondary | ICD-10-CM | POA: Diagnosis not present

## 2015-03-08 ENCOUNTER — Other Ambulatory Visit: Payer: Self-pay

## 2015-03-08 ENCOUNTER — Telehealth: Payer: Self-pay | Admitting: Nurse Practitioner

## 2015-03-08 MED ORDER — GLUCOSE BLOOD VI STRP: ORAL_STRIP | Status: DC

## 2015-03-08 MED ORDER — ACCU-CHEK MULTICLIX LANCETS MISC: Status: DC

## 2015-03-09 NOTE — Telephone Encounter (Signed)
Ordered yesterday.

## 2015-03-14 ENCOUNTER — Telehealth: Payer: Self-pay | Admitting: Nurse Practitioner

## 2015-03-16 NOTE — Telephone Encounter (Signed)
Done 03/08/15 by MMM

## 2015-03-17 ENCOUNTER — Telehealth: Payer: Self-pay

## 2015-03-17 NOTE — Telephone Encounter (Signed)
Insurance denied prior authorization for tier exception for Hydrocodone/Acetaminophen   He must first try covered meds: Hydromorphone and oxydodone

## 2015-03-18 MED ORDER — OXYCODONE HCL 10 MG PO TABS: 10.0000 mg | ORAL_TABLET | Freq: Every day | ORAL | Status: DC | PRN

## 2015-03-18 NOTE — Telephone Encounter (Signed)
Patient aware that Rx is ready for pick up 

## 2015-03-18 NOTE — Telephone Encounter (Signed)
Pain rx ready for pick up  

## 2015-04-15 ENCOUNTER — Other Ambulatory Visit: Payer: Self-pay | Admitting: Family

## 2015-04-16 ENCOUNTER — Other Ambulatory Visit: Payer: Self-pay | Admitting: Nurse Practitioner

## 2015-04-27 ENCOUNTER — Other Ambulatory Visit: Payer: Self-pay | Admitting: Nurse Practitioner

## 2015-04-27 NOTE — Telephone Encounter (Signed)
Last office visit 02-22-15. Please advise on scripts for cough and congestion.

## 2015-04-28 NOTE — Telephone Encounter (Signed)
Pt aware ntbs 

## 2015-04-28 NOTE — Telephone Encounter (Signed)
Will NTBS to get antibiotic

## 2015-05-11 ENCOUNTER — Telehealth: Payer: Self-pay | Admitting: Nurse Practitioner

## 2015-05-11 NOTE — Telephone Encounter (Signed)
Sample up front. Patient notified

## 2015-05-26 DIAGNOSIS — M5030 Other cervical disc degeneration, unspecified cervical region: Secondary | ICD-10-CM | POA: Diagnosis not present

## 2015-05-26 DIAGNOSIS — M9901 Segmental and somatic dysfunction of cervical region: Secondary | ICD-10-CM | POA: Diagnosis not present

## 2015-05-26 DIAGNOSIS — M9902 Segmental and somatic dysfunction of thoracic region: Secondary | ICD-10-CM | POA: Diagnosis not present

## 2015-05-26 DIAGNOSIS — M9903 Segmental and somatic dysfunction of lumbar region: Secondary | ICD-10-CM | POA: Diagnosis not present

## 2015-05-26 DIAGNOSIS — M9904 Segmental and somatic dysfunction of sacral region: Secondary | ICD-10-CM | POA: Diagnosis not present

## 2015-05-30 ENCOUNTER — Ambulatory Visit (INDEPENDENT_AMBULATORY_CARE_PROVIDER_SITE_OTHER): Payer: Medicare Other | Admitting: *Deleted

## 2015-05-30 DIAGNOSIS — E538 Deficiency of other specified B group vitamins: Secondary | ICD-10-CM

## 2015-05-30 NOTE — Progress Notes (Signed)
Pt given B12 injection IM left deltoid and tolerated well. °

## 2015-05-30 NOTE — Patient Instructions (Signed)

## 2015-06-02 DIAGNOSIS — M5030 Other cervical disc degeneration, unspecified cervical region: Secondary | ICD-10-CM | POA: Diagnosis not present

## 2015-06-02 DIAGNOSIS — M9904 Segmental and somatic dysfunction of sacral region: Secondary | ICD-10-CM | POA: Diagnosis not present

## 2015-06-02 DIAGNOSIS — M9903 Segmental and somatic dysfunction of lumbar region: Secondary | ICD-10-CM | POA: Diagnosis not present

## 2015-06-02 DIAGNOSIS — M9901 Segmental and somatic dysfunction of cervical region: Secondary | ICD-10-CM | POA: Diagnosis not present

## 2015-06-02 DIAGNOSIS — M9902 Segmental and somatic dysfunction of thoracic region: Secondary | ICD-10-CM | POA: Diagnosis not present

## 2015-06-16 DIAGNOSIS — M9904 Segmental and somatic dysfunction of sacral region: Secondary | ICD-10-CM | POA: Diagnosis not present

## 2015-06-16 DIAGNOSIS — M9901 Segmental and somatic dysfunction of cervical region: Secondary | ICD-10-CM | POA: Diagnosis not present

## 2015-06-16 DIAGNOSIS — M5030 Other cervical disc degeneration, unspecified cervical region: Secondary | ICD-10-CM | POA: Diagnosis not present

## 2015-06-16 DIAGNOSIS — M9902 Segmental and somatic dysfunction of thoracic region: Secondary | ICD-10-CM | POA: Diagnosis not present

## 2015-06-16 DIAGNOSIS — M9903 Segmental and somatic dysfunction of lumbar region: Secondary | ICD-10-CM | POA: Diagnosis not present

## 2015-06-30 DIAGNOSIS — M9904 Segmental and somatic dysfunction of sacral region: Secondary | ICD-10-CM | POA: Diagnosis not present

## 2015-06-30 DIAGNOSIS — M9902 Segmental and somatic dysfunction of thoracic region: Secondary | ICD-10-CM | POA: Diagnosis not present

## 2015-06-30 DIAGNOSIS — M9901 Segmental and somatic dysfunction of cervical region: Secondary | ICD-10-CM | POA: Diagnosis not present

## 2015-06-30 DIAGNOSIS — M9903 Segmental and somatic dysfunction of lumbar region: Secondary | ICD-10-CM | POA: Diagnosis not present

## 2015-06-30 DIAGNOSIS — M5030 Other cervical disc degeneration, unspecified cervical region: Secondary | ICD-10-CM | POA: Diagnosis not present

## 2015-07-07 DIAGNOSIS — M9903 Segmental and somatic dysfunction of lumbar region: Secondary | ICD-10-CM | POA: Diagnosis not present

## 2015-07-07 DIAGNOSIS — M9902 Segmental and somatic dysfunction of thoracic region: Secondary | ICD-10-CM | POA: Diagnosis not present

## 2015-07-07 DIAGNOSIS — M9901 Segmental and somatic dysfunction of cervical region: Secondary | ICD-10-CM | POA: Diagnosis not present

## 2015-07-07 DIAGNOSIS — M5030 Other cervical disc degeneration, unspecified cervical region: Secondary | ICD-10-CM | POA: Diagnosis not present

## 2015-07-07 DIAGNOSIS — M9904 Segmental and somatic dysfunction of sacral region: Secondary | ICD-10-CM | POA: Diagnosis not present

## 2015-07-20 ENCOUNTER — Other Ambulatory Visit: Payer: Self-pay | Admitting: Nurse Practitioner

## 2015-07-21 ENCOUNTER — Other Ambulatory Visit: Payer: Self-pay | Admitting: Nurse Practitioner

## 2015-07-21 ENCOUNTER — Telehealth: Payer: Self-pay | Admitting: Nurse Practitioner

## 2015-07-21 DIAGNOSIS — M9904 Segmental and somatic dysfunction of sacral region: Secondary | ICD-10-CM | POA: Diagnosis not present

## 2015-07-21 DIAGNOSIS — M9902 Segmental and somatic dysfunction of thoracic region: Secondary | ICD-10-CM | POA: Diagnosis not present

## 2015-07-21 DIAGNOSIS — M9901 Segmental and somatic dysfunction of cervical region: Secondary | ICD-10-CM | POA: Diagnosis not present

## 2015-07-21 DIAGNOSIS — M9903 Segmental and somatic dysfunction of lumbar region: Secondary | ICD-10-CM | POA: Diagnosis not present

## 2015-07-21 DIAGNOSIS — M5030 Other cervical disc degeneration, unspecified cervical region: Secondary | ICD-10-CM | POA: Diagnosis not present

## 2015-07-21 DIAGNOSIS — E785 Hyperlipidemia, unspecified: Secondary | ICD-10-CM

## 2015-07-21 MED ORDER — HYDROCODONE-ACETAMINOPHEN 7.5-325 MG PO TABS: 1.0000 | ORAL_TABLET | Freq: Four times a day (QID) | ORAL | Status: DC | PRN

## 2015-07-21 NOTE — Telephone Encounter (Signed)
Hydrocodone is not on list last time he got anything he got oxycodone

## 2015-07-21 NOTE — Telephone Encounter (Signed)
Hydrocodone is not on med list?

## 2015-07-21 NOTE — Telephone Encounter (Signed)
lmtcb

## 2015-07-22 MED ORDER — METHOCARBAMOL 500 MG PO TABS: ORAL_TABLET | ORAL | Status: DC

## 2015-07-22 NOTE — Telephone Encounter (Signed)
roabaxin refilled

## 2015-07-26 ENCOUNTER — Telehealth: Payer: Self-pay | Admitting: Nurse Practitioner

## 2015-07-26 NOTE — Telephone Encounter (Signed)
Printed for Debbi 

## 2015-07-28 ENCOUNTER — Telehealth: Payer: Self-pay

## 2015-07-28 NOTE — Telephone Encounter (Signed)
I need to know the dx for him being on RObaxin for prior authorization?

## 2015-07-28 NOTE — Telephone Encounter (Signed)
Cervical neck pain

## 2015-07-29 ENCOUNTER — Encounter: Payer: Self-pay | Admitting: Nurse Practitioner

## 2015-07-29 ENCOUNTER — Ambulatory Visit (INDEPENDENT_AMBULATORY_CARE_PROVIDER_SITE_OTHER): Payer: Medicare Other | Admitting: Nurse Practitioner

## 2015-07-29 VITALS — BP 181/74 | HR 60 | Temp 96.9°F | Ht 65.0 in | Wt 183.0 lb

## 2015-07-29 DIAGNOSIS — K219 Gastro-esophageal reflux disease without esophagitis: Secondary | ICD-10-CM

## 2015-07-29 DIAGNOSIS — Z6830 Body mass index (BMI) 30.0-30.9, adult: Secondary | ICD-10-CM

## 2015-07-29 DIAGNOSIS — D649 Anemia, unspecified: Secondary | ICD-10-CM

## 2015-07-29 DIAGNOSIS — N400 Enlarged prostate without lower urinary tract symptoms: Secondary | ICD-10-CM

## 2015-07-29 DIAGNOSIS — R5383 Other fatigue: Secondary | ICD-10-CM

## 2015-07-29 DIAGNOSIS — E1159 Type 2 diabetes mellitus with other circulatory complications: Secondary | ICD-10-CM | POA: Diagnosis not present

## 2015-07-29 DIAGNOSIS — I10 Essential (primary) hypertension: Secondary | ICD-10-CM | POA: Diagnosis not present

## 2015-07-29 DIAGNOSIS — E785 Hyperlipidemia, unspecified: Secondary | ICD-10-CM | POA: Diagnosis not present

## 2015-07-29 DIAGNOSIS — J449 Chronic obstructive pulmonary disease, unspecified: Secondary | ICD-10-CM

## 2015-07-29 DIAGNOSIS — N189 Chronic kidney disease, unspecified: Secondary | ICD-10-CM | POA: Diagnosis not present

## 2015-07-29 DIAGNOSIS — E538 Deficiency of other specified B group vitamins: Secondary | ICD-10-CM | POA: Diagnosis not present

## 2015-07-29 DIAGNOSIS — IMO0001 Reserved for inherently not codable concepts without codable children: Secondary | ICD-10-CM

## 2015-07-29 LAB — CMP14+EGFR
ALT: 11 IU/L (ref 0–44)
AST: 35 IU/L (ref 0–40)
Albumin/Globulin Ratio: 1.7 (ref 1.2–2.2)
Albumin: 4.3 g/dL (ref 3.5–4.7)
Alkaline Phosphatase: 35 IU/L — ABNORMAL LOW (ref 39–117)
BUN/Creatinine Ratio: 9 — ABNORMAL LOW (ref 10–22)
BUN: 11 mg/dL (ref 8–27)
Bilirubin Total: 0.4 mg/dL (ref 0.0–1.2)
CALCIUM: 9.7 mg/dL (ref 8.6–10.2)
CO2: 26 mmol/L (ref 18–29)
CREATININE: 1.19 mg/dL (ref 0.76–1.27)
Chloride: 100 mmol/L (ref 96–106)
GFR, EST AFRICAN AMERICAN: 63 mL/min/{1.73_m2} (ref >59)
GFR, EST NON AFRICAN AMERICAN: 55 mL/min/{1.73_m2} — AB (ref >59)
GLUCOSE: 91 mg/dL (ref 65–99)
Globulin, Total: 2.6 g/dL (ref 1.5–4.5)
POTASSIUM: 4.6 mmol/L (ref 3.5–5.2)
Sodium: 142 mmol/L (ref 134–144)
TOTAL PROTEIN: 6.9 g/dL (ref 6.0–8.5)

## 2015-07-29 LAB — LIPID PANEL
CHOL/HDL RATIO: 2.5 ratio (ref 0.0–5.0)
Cholesterol, Total: 152 mg/dL (ref 100–199)
HDL: 61 mg/dL (ref >39)
LDL Calculated: 69 mg/dL (ref 0–99)
TRIGLYCERIDES: 108 mg/dL (ref 0–149)
VLDL CHOLESTEROL CAL: 22 mg/dL (ref 5–40)

## 2015-07-29 LAB — BAYER DCA HB A1C WAIVED: HB A1C: 6.1 % (ref <7.0)

## 2015-07-29 MED ORDER — PANTOPRAZOLE SODIUM 40 MG PO TBEC: DELAYED_RELEASE_TABLET | ORAL | Status: DC

## 2015-07-29 MED ORDER — NIFEDIPINE ER OSMOTIC RELEASE 30 MG PO TB24: ORAL_TABLET | ORAL | Status: DC

## 2015-07-29 MED ORDER — METFORMIN HCL 500 MG PO TABS: ORAL_TABLET | ORAL | Status: DC

## 2015-07-29 MED ORDER — SIMVASTATIN 40 MG PO TABS: ORAL_TABLET | ORAL | Status: DC

## 2015-07-29 MED ORDER — FERROUS SULFATE 325 (65 FE) MG PO TABS: ORAL_TABLET | ORAL | Status: AC

## 2015-07-29 MED ORDER — FENOFIBRATE 160 MG PO TABS: ORAL_TABLET | ORAL | Status: DC

## 2015-07-29 MED ORDER — TAMSULOSIN HCL 0.4 MG PO CAPS: ORAL_CAPSULE | ORAL | Status: DC

## 2015-07-29 MED ORDER — NITROGLYCERIN 0.4 MG SL SUBL: 0.4000 mg | SUBLINGUAL_TABLET | SUBLINGUAL | Status: DC | PRN

## 2015-07-29 MED ORDER — LOSARTAN POTASSIUM-HCTZ 100-25 MG PO TABS: ORAL_TABLET | ORAL | Status: DC

## 2015-07-29 MED ORDER — CLONIDINE HCL 0.3 MG PO TABS: ORAL_TABLET | ORAL | Status: DC

## 2015-07-29 NOTE — Progress Notes (Signed)
Subjective:    Patient ID: Collin Campbell, male    DOB: [birth year], 82 y.o.   MRN: [id number]  Patient here today for follow up of chronic medical problems.  Hypertension This is a recurrent problem. The current episode started more than 1 year ago. The problem is controlled. Pertinent negatives include no chest pain or palpitations. Risk factors for coronary artery disease include diabetes mellitus and male gender.  Diabetes He presents for his follow-up diabetic visit. He has type 2 diabetes mellitus. His disease course has been stable. Pertinent negatives for hypoglycemia include no dizziness, seizures or speech difficulty. Pertinent negatives for diabetes include no chest pain and no visual change. Symptoms are stable. His weight is stable. He is following a generally healthy diet. His breakfast blood glucose range is generally 140-180 mg/dl.  Hyperlipidemia This is a recurrent problem. The current episode started more than 1 year ago. The problem is controlled. Pertinent negatives include no chest pain.  B12 deficiency Patient wants to give himself shots at home- Needs rx Chronic kidney disease  No longer sees specialist- we are watching him and will refer back if needed Anemia Due to chronic blood loss- have not been able to determine source- takes iron supplements daily BPH Still take flomax- has some trouble emptying completely COPD symbicort BID- works well has not needed albulterol at all in over 3-4 months  Review of Systems  Constitutional: Negative.   HENT: Negative.   Eyes: Negative.   Respiratory: Negative.   Cardiovascular: Negative.  Negative for chest pain and palpitations.  Gastrointestinal: Negative.   Endocrine: Negative.   Genitourinary: Negative.   Musculoskeletal: Negative.   Skin: Negative.   Allergic/Immunologic: Negative.   Neurological: Negative.  Negative for dizziness, seizures and speech difficulty.  Hematological: Negative.    Psychiatric/Behavioral: Negative.   All other systems reviewed and are negative.      Objective:   Physical Exam  Constitutional: He is oriented to person, place, and time. He appears well-developed and well-nourished.  HENT:  Head: Normocephalic.  Eyes: Pupils are equal, round, and reactive to light.  Neck: Normal range of motion. No JVD present. No thyromegaly present.  Cardiovascular: Normal rate, regular rhythm and normal heart sounds.   Pulmonary/Chest: Effort normal.  Diminished breath sounds   Abdominal: Soft. Bowel sounds are normal. He exhibits no mass. There is no tenderness. There is no rebound.  Musculoskeletal: Normal range of motion. He exhibits no edema (trace amount on RL ankle).  Neurological: He is alert and oriented to person, place, and time.  Skin: Skin is warm and dry.  Psychiatric: He has a normal mood and affect. His behavior is normal. Judgment and thought content normal.        Assessment & Plan:   1. Hyperlipidemia Low fat diet - Lipid panel - simvastatin (ZOCOR) 40 MG tablet; Take 1 tablet by mouth at  bedtime  Dispense: 90 tablet; Refill: 1 - fenofibrate 160 MG tablet; Take 1 tablet by mouth  daily  Dispense: 90 tablet; Refill: 1  2. Essential hypertension, benign No salt in diet - CMP14+EGFR - NIFEdipine (PROCARDIA-XL/ADALAT-CC/NIFEDICAL-XL) 30 MG 24 hr tablet; Take 1 tablet by mouth  daily  Dispense: 90 tablet; Refill: 3 - losartan-hydrochlorothiazide (HYZAAR) 100-25 MG tablet; TAKE 1 TABLET EVERY DAY  WITH BREAKFAST  Dispense: 90 tablet; Refill: 3 - cloNIDine (CATAPRES) 0.3 MG tablet; Take 1 tablet by mouth  twice a day  Dispense: 180 tablet; Refill: 5  3. Type 2 diabetes mellitus with  other circulatory complication (HCC) Low carb diet - Bayer DCA Hb A1c Waived - metFORMIN (GLUCOPHAGE) 500 MG tablet; Take 1 tablet by mouth  twice a day  Dispense: 180 tablet; Refill: 1  4. COPD bronchitis  5. Vitamin B12 deficiency  6. Chronic kidney  disease, unspecified stage  7. BMI 30.0-30.9,adult Low fat diet   8. BPH (benign prostatic hyperplasia) - tamsulosin (FLOMAX) 0.4 MG CAPS capsule; Take 1 capsule by mouth  every day  Dispense: 90 capsule; Refill: 1    10. Gastroesophageal reflux disease without esophagitis No eating up to 2 hours before bedtime. No spicy foods  - pantoprazole (PROTONIX) 40 MG tablet; Take 1 tablet by mouth  daily with breakfast  Dispense: 90 tablet; Refill: 1  11. Anemia, unspecified anemia type Eat foods high in iron - ferrous sulfate 325 (65 FE) MG tablet; TAKE 2 TABLETS TWO TIMES DAILY WITH A MEAL.  Dispense: 360 tablet; Refill: 1   Continue all meds Labs pending Health Maintenance reviewed Diet and exercise encouraged RTO 3 months Rosalio Loud FNP Student  Mary-Margaret Hassell Done, [place]

## 2015-07-29 NOTE — Patient Instructions (Signed)
Diabetes and Foot Care Diabetes may cause you to have problems because of poor blood supply (circulation) to your feet and legs. This may cause the skin on your feet to become thinner, break easier, and heal more slowly. Your skin may become dry, and the skin may peel and crack. You may also have nerve damage in your legs and feet causing decreased feeling in them. You may not notice minor injuries to your feet that could lead to infections or more serious problems. Taking care of your feet is one of the most important things you can do for yourself.  HOME CARE INSTRUCTIONS  Wear shoes at all times, even in the house. Do not go barefoot. Bare feet are easily injured.  Check your feet daily for blisters, cuts, and redness. If you cannot see the bottom of your feet, use a mirror or ask someone for help.  Wash your feet with warm water (do not use hot water) and mild soap. Then pat your feet and the areas between your toes until they are completely dry. Do not soak your feet as this can dry your skin.  Apply a moisturizing lotion or petroleum jelly (that does not contain alcohol and is unscented) to the skin on your feet and to dry, brittle toenails. Do not apply lotion between your toes.  Trim your toenails straight across. Do not dig under them or around the cuticle. File the edges of your nails with an emery board or nail file.  Do not cut corns or calluses or try to remove them with medicine.  Wear clean socks or stockings every day. Make sure they are not too tight. Do not wear knee-high stockings since they may decrease blood flow to your legs.  Wear shoes that fit properly and have enough cushioning. To break in new shoes, wear them for just a few hours a day. This prevents you from injuring your feet. Always look in your shoes before you put them on to be sure there are no objects inside.  Do not cross your legs. This may decrease the blood flow to your feet.  If you find a minor scrape,  cut, or break in the skin on your feet, keep it and the skin around it clean and dry. These areas may be cleansed with mild soap and water. Do not cleanse the area with peroxide, alcohol, or iodine.  When you remove an adhesive bandage, be sure not to damage the skin around it.  If you have a wound, look at it several times a day to make sure it is healing.  Do not use heating pads or hot water bottles. They may burn your skin. If you have lost feeling in your feet or legs, you may not know it is happening until it is too late.  Make sure your health care provider performs a complete foot exam at least annually or more often if you have foot problems. Report any cuts, sores, or bruises to your health care provider immediately. SEEK MEDICAL CARE IF:   You have an injury that is not healing.  You have cuts or breaks in the skin.  You have an ingrown nail.  You notice redness on your legs or feet.  You feel burning or tingling in your legs or feet.  You have pain or cramps in your legs and feet.  Your legs or feet are numb.  Your feet always feel cold. SEEK IMMEDIATE MEDICAL CARE IF:   There is increasing redness,   swelling, or pain in or around a wound.  There is a red line that goes up your leg.  Pus is coming from a wound.  You develop a fever or as directed by your health care provider.  You notice a bad smell coming from an ulcer or wound.   This information is not intended to replace advice given to you by your health care provider. Make sure you discuss any questions you have with your health care provider.   Document Released: 04/27/2000 Document Revised: 12/31/2012 Document Reviewed: 10/07/2012 Elsevier Interactive Patient Education 2016 [employer]  

## 2015-08-02 ENCOUNTER — Telehealth: Payer: Self-pay | Admitting: Nurse Practitioner

## 2015-08-02 ENCOUNTER — Telehealth: Payer: Self-pay

## 2015-08-02 MED ORDER — TIZANIDINE HCL 4 MG PO TABS: 4.0000 mg | ORAL_TABLET | Freq: Four times a day (QID) | ORAL | Status: DC | PRN

## 2015-08-02 NOTE — Telephone Encounter (Signed)
Symbicort 80/4.5 samples are not available at this time, patient informed

## 2015-08-02 NOTE — Telephone Encounter (Signed)
Robaxin to tizadine- rx sent to [facility]

## 2015-08-02 NOTE — Telephone Encounter (Signed)
insurance denied prior authorization for Methocarbamol    Must try and fail two of the following : Celecoxib, Diflunisal, Etodolac, Ibuprofen, Ketoprofen, Meloxicam, Nabumetone Naproxen, Tizanidine

## 2015-08-02 NOTE — Telephone Encounter (Signed)
Patient informed. 

## 2015-08-05 ENCOUNTER — Other Ambulatory Visit: Payer: Self-pay | Admitting: Nurse Practitioner

## 2015-08-05 ENCOUNTER — Telehealth: Payer: Self-pay | Admitting: Family Medicine

## 2015-08-05 NOTE — Telephone Encounter (Signed)
Stp's daughter and advised he must try 2 other medications prior to insurance authorizing Robaxin. Pt's mother voiced understanding.

## 2015-09-12 ENCOUNTER — Telehealth: Payer: Self-pay | Admitting: Nurse Practitioner

## 2015-09-12 MED ORDER — TIZANIDINE HCL 4 MG PO TABS: 4.0000 mg | ORAL_TABLET | Freq: Four times a day (QID) | ORAL | Status: DC | PRN

## 2015-09-12 MED ORDER — HYDROCODONE-ACETAMINOPHEN 7.5-325 MG PO TABS: 1.0000 | ORAL_TABLET | Freq: Four times a day (QID) | ORAL | Status: DC | PRN

## 2015-09-12 NOTE — Telephone Encounter (Signed)
Patient aware that rx is ready to be picked up.  

## 2015-09-12 NOTE — Telephone Encounter (Signed)
Pain rx ready for pick up  

## 2015-10-12 ENCOUNTER — Other Ambulatory Visit: Payer: Self-pay | Admitting: Nurse Practitioner

## 2015-10-17 ENCOUNTER — Other Ambulatory Visit: Payer: Self-pay | Admitting: Nurse Practitioner

## 2015-10-18 MED ORDER — HYDROCODONE-ACETAMINOPHEN 7.5-325 MG PO TABS: 1.0000 | ORAL_TABLET | Freq: Four times a day (QID) | ORAL | Status: DC | PRN

## 2015-10-18 NOTE — Telephone Encounter (Signed)
Last filled 09/12/15, last seen 07/29/15. Rx will print

## 2015-10-18 NOTE — Telephone Encounter (Signed)
Pt aware written Rx is at front desk ready for pickup  

## 2015-10-18 NOTE — Telephone Encounter (Signed)
Hydrocodone rx ready for pick up Last refill without being seen

## 2015-10-20 ENCOUNTER — Ambulatory Visit (INDEPENDENT_AMBULATORY_CARE_PROVIDER_SITE_OTHER): Payer: Medicare Other | Admitting: *Deleted

## 2015-10-20 DIAGNOSIS — E538 Deficiency of other specified B group vitamins: Secondary | ICD-10-CM | POA: Diagnosis not present

## 2015-10-20 NOTE — Patient Instructions (Signed)

## 2015-10-20 NOTE — Progress Notes (Signed)
Pt given B12 injection IM right deltoid and tolerated well. 

## 2015-11-25 ENCOUNTER — Ambulatory Visit (INDEPENDENT_AMBULATORY_CARE_PROVIDER_SITE_OTHER): Payer: Medicare Other | Admitting: Nurse Practitioner

## 2015-11-25 ENCOUNTER — Encounter: Payer: Self-pay | Admitting: Nurse Practitioner

## 2015-11-25 VITALS — BP 144/64 | HR 68 | Temp 98.0°F | Ht 65.0 in | Wt 183.0 lb

## 2015-11-25 DIAGNOSIS — E538 Deficiency of other specified B group vitamins: Secondary | ICD-10-CM

## 2015-11-25 DIAGNOSIS — J449 Chronic obstructive pulmonary disease, unspecified: Secondary | ICD-10-CM | POA: Diagnosis not present

## 2015-11-25 DIAGNOSIS — N189 Chronic kidney disease, unspecified: Secondary | ICD-10-CM | POA: Diagnosis not present

## 2015-11-25 DIAGNOSIS — N400 Enlarged prostate without lower urinary tract symptoms: Secondary | ICD-10-CM

## 2015-11-25 DIAGNOSIS — IMO0001 Reserved for inherently not codable concepts without codable children: Secondary | ICD-10-CM

## 2015-11-25 DIAGNOSIS — Z6830 Body mass index (BMI) 30.0-30.9, adult: Secondary | ICD-10-CM

## 2015-11-25 DIAGNOSIS — E785 Hyperlipidemia, unspecified: Secondary | ICD-10-CM | POA: Diagnosis not present

## 2015-11-25 DIAGNOSIS — E1159 Type 2 diabetes mellitus with other circulatory complications: Secondary | ICD-10-CM

## 2015-11-25 DIAGNOSIS — I10 Essential (primary) hypertension: Secondary | ICD-10-CM | POA: Diagnosis not present

## 2015-11-25 DIAGNOSIS — I2510 Atherosclerotic heart disease of native coronary artery without angina pectoris: Secondary | ICD-10-CM | POA: Diagnosis not present

## 2015-11-25 LAB — BAYER DCA HB A1C WAIVED: HB A1C: 6.3 % (ref <7.0)

## 2015-11-25 MED ORDER — NIFEDIPINE ER OSMOTIC RELEASE 30 MG PO TB24: ORAL_TABLET | ORAL | Status: AC

## 2015-11-25 MED ORDER — BUDESONIDE-FORMOTEROL FUMARATE 80-4.5 MCG/ACT IN AERO: 1.0000 | INHALATION_SPRAY | Freq: Two times a day (BID) | RESPIRATORY_TRACT | Status: AC

## 2015-11-25 MED ORDER — LOSARTAN POTASSIUM-HCTZ 100-25 MG PO TABS: ORAL_TABLET | ORAL | Status: AC

## 2015-11-25 MED ORDER — FENOFIBRATE 160 MG PO TABS: ORAL_TABLET | ORAL | Status: DC

## 2015-11-25 MED ORDER — CLONIDINE HCL 0.3 MG PO TABS: ORAL_TABLET | ORAL | Status: AC

## 2015-11-25 MED ORDER — TAMSULOSIN HCL 0.4 MG PO CAPS: ORAL_CAPSULE | ORAL | Status: DC

## 2015-11-25 MED ORDER — SIMVASTATIN 40 MG PO TABS: ORAL_TABLET | ORAL | Status: DC

## 2015-11-25 NOTE — Progress Notes (Signed)
Subjective:    Patient ID: Collin Campbell, male    DOB: 18-Dec-1925, 82 y.o.   MRN: [id number]  Patient here today for follow up of chronic medical problems. He has no complaintstoday   Outpatient Encounter Prescriptions as of 11/25/2015  Medication Sig  . ACCU-CHEK AVIVA PLUS test strip Check glucose 4 times daily  . albuterol (PROVENTIL HFA;VENTOLIN HFA) 108 (90 BASE) MCG/ACT inhaler Inhale 1-2 puffs into the lungs every 6 (six) hours as needed for wheezing or shortness of breath.  Marland Kitchen aspirin 325 MG tablet Take 325 mg by mouth daily.  . budesonide-formoterol (SYMBICORT) 80-4.5 MCG/ACT inhaler Inhale 1 puff into the lungs 2 (two) times daily.  . Cholecalciferol (VITAMIN D3) 2000 UNITS TABS Take 1 tablet by mouth daily.   . cloNIDine (CATAPRES) 0.3 MG tablet Take 1 tablet by mouth  twice a day  . cyanocobalamin (,VITAMIN B-12,) 1000 MCG/ML injection Inject 1 mL (1,000 mcg total) into the muscle every 30 (thirty) days.  Marland Kitchen docusate sodium (COLACE) 100 MG capsule Take 100 mg by mouth 2 (two) times daily.  . fenofibrate 160 MG tablet Take 1 tablet by mouth  daily  . ferrous sulfate 325 (65 FE) MG tablet TAKE 2 TABLETS TWO TIMES DAILY WITH A MEAL.  Marland Kitchen glycerin adult 2 G SUPP Place 1 suppository rectally once as needed for moderate constipation.  Marland Kitchen HYDROcodone-acetaminophen (NORCO) 7.5-325 MG tablet Take 1 tablet by mouth every 6 (six) hours as needed for moderate pain.  . Lancets (ACCU-CHEK MULTICLIX) lancets Use to check glucose 4 times daily  . loratadine (CLARITIN) 10 MG tablet Take 10 mg by mouth at bedtime.  Marland Kitchen losartan-hydrochlorothiazide (HYZAAR) 100-25 MG tablet TAKE 1 TABLET EVERY DAY  WITH BREAKFAST  . magnesium hydroxide (MILK OF MAGNESIA) 400 MG/5ML suspension Take 5 mLs by mouth daily as needed for indigestion.  . metFORMIN (GLUCOPHAGE) 500 MG tablet Take 1 tablet by mouth  twice a day  . Multiple Vitamin (MULTIVITAMIN WITH MINERALS) TABS Take 0.5 tablets by mouth 2 (two) times daily.   . naproxen (NAPROSYN) 500 MG tablet Take 1 tablet (500 mg total) by mouth 2 (two) times daily with a meal.  . NIFEdipine (PROCARDIA-XL/ADALAT-CC/NIFEDICAL-XL) 30 MG 24 hr tablet Take 1 tablet by mouth  daily  . nitroGLYCERIN (NITROSTAT) 0.4 MG SL tablet Place 1 tablet (0.4 mg total) under the tongue every 5 (five) minutes as needed. For chest pain  . pantoprazole (PROTONIX) 40 MG tablet Take 1 tablet by mouth  daily with breakfast  . Pramoxine-HC (HYDROCORTISONE ACE-PRAMOXINE) 2.5-1 % CREA Use on bilateral arms TID  . Probiotic Product (PROBIOTIC DAILY) CAPS Take 1 capsule by mouth at bedtime.  . simvastatin (ZOCOR) 40 MG tablet Take 1 tablet by mouth at  bedtime  . tamsulosin (FLOMAX) 0.4 MG CAPS capsule Take 1 capsule by mouth  every day  . tiZANidine (ZANAFLEX) 4 MG tablet Take 1 tablet by mouth  every 6 hours as needed for muscle spasm(s)  . Zinc 50 MG CAPS Take 1 capsule by mouth daily with breakfast.   Facility-Administered Encounter Medications as of 11/25/2015  Medication  . cyanocobalamin ((VITAMIN B-12)) injection 1,000 mcg     Hypertension This is a recurrent problem. The current episode started more than 1 year ago. The problem is controlled. Pertinent negatives include no chest pain or palpitations. Risk factors for coronary artery disease include diabetes mellitus and male gender.  Diabetes He presents for his follow-up diabetic visit. He has type 2 diabetes mellitus.  His disease course has been stable. Pertinent negatives for hypoglycemia include no dizziness, seizures or speech difficulty. Pertinent negatives for diabetes include no chest pain and no visual change. Symptoms are stable. His weight is stable. He is following a generally healthy diet. His breakfast blood glucose range is generally 110-130 mg/dl. His highest blood glucose is 140-180 mg/dl. His overall blood glucose range is 110-130 mg/dl.  Hyperlipidemia This is a recurrent problem. The current episode started more  than 1 year ago. The problem is controlled. Pertinent negatives include no chest pain.  B12 deficiency Patient wants to give himself shots at home- Needs rx Chronic kidney disease  No longer sees specialist- we are watching him and will refer back if needed Anemia Due to chronic blood loss- have not been able to determine source- takes iron supplements daily BPH Still take flomax- has some trouble emptying completely COPD symbicort BID- works well has not needed albulterol at all in over 3-4 months  Review of Systems  Constitutional: Negative.   HENT: Negative.   Eyes: Negative.   Respiratory: Negative.   Cardiovascular: Negative.  Negative for chest pain and palpitations.  Gastrointestinal: Negative.   Endocrine: Negative.   Genitourinary: Negative.   Musculoskeletal: Negative.   Skin: Negative.   Allergic/Immunologic: Negative.   Neurological: Negative.  Negative for dizziness, seizures and speech difficulty.  Hematological: Negative.   Psychiatric/Behavioral: Negative.   All other systems reviewed and are negative.      Objective:   Physical Exam  Constitutional: He is oriented to person, place, and time. He appears well-developed and well-nourished.  HENT:  Head: Normocephalic.  Eyes: Pupils are equal, round, and reactive to light.  Neck: Normal range of motion. No JVD present. No thyromegaly present.  Cardiovascular: Normal rate, regular rhythm and normal heart sounds.   Pulmonary/Chest: Effort normal.  Diminished breath sounds   Abdominal: Soft. Bowel sounds are normal. He exhibits no mass. There is no tenderness. There is no rebound.  Musculoskeletal: Normal range of motion. He exhibits no edema (trace amount on RL ankle).  Neurological: He is alert and oriented to person, place, and time.  Skin: Skin is warm and dry.  Psychiatric: He has a normal mood and affect. His behavior is normal. Judgment and thought content normal.   BP 144/64 mmHg  Pulse 68   Temp(Src) 98 F (36.7 C) (Oral)  Ht '5\' 5"'$  (1.651 m)  Wt 183 lb (83.008 kg)  BMI 30.45 kg/m2  hgba1c- 6.3%     Assessment & Plan:   1. Hyperlipidemia Low fat diet - Lipid panel - simvastatin (ZOCOR) 40 MG tablet; Take 1 tablet by mouth at  bedtime  Dispense: 90 tablet; Refill: 1 - fenofibrate 160 MG tablet; Take 1 tablet by mouth  daily  Dispense: 90 tablet; Refill: 1  2. Essential hypertension, benign Do nt add salt to diet - CMP14+EGFR - NIFEdipine (PROCARDIA-XL/ADALAT-CC/NIFEDICAL-XL) 30 MG 24 hr tablet; Take 1 tablet by mouth  daily  Dispense: 90 tablet; Refill: 3 - losartan-hydrochlorothiazide (HYZAAR) 100-25 MG tablet; TAKE 1 TABLET EVERY DAY  WITH BREAKFAST  Dispense: 90 tablet; Refill: 3 - cloNIDine (CATAPRES) 0.3 MG tablet; Take 1 tablet by mouth  twice a day  Dispense: 180 tablet; Refill: 5  3. 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 Atherosclerosis of native coronary artery of native heart without angina pectoris  5. COPD bronchitis - budesonide-formoterol (SYMBICORT) 80-4.5 MCG/ACT inhaler;  Inhale 1 puff into the lungs 2 (two) times daily.  Dispense: 1 Inhaler; Refill: 5  6. Vitamin B12 deficiency  7. Chronic kidney disease, unspecified stage Watching labs for now  8. BPH (benign prostatic hyperplasia) - tamsulosin (FLOMAX) 0.4 MG CAPS capsule; Take 1 capsule by mouth  every day  Dispense: 90 capsule; Refill: 1  9. BMI 30.0-30.9,adult Discussed diet and exercise for person with BMI >25 Will recheck weight in 3-6 months    Labs pending Health maintenance reviewed Diet and exercise encouraged Continue all meds Follow up  In 3 month   [place], FNP

## 2015-11-26 LAB — CMP14+EGFR
A/G RATIO: 1.9 (ref 1.2–2.2)
ALT: 14 IU/L (ref 0–44)
AST: 28 IU/L (ref 0–40)
Albumin: 4.3 g/dL (ref 3.5–4.7)
Alkaline Phosphatase: 33 IU/L — ABNORMAL LOW (ref 39–117)
BILIRUBIN TOTAL: 0.4 mg/dL (ref 0.0–1.2)
BUN/Creatinine Ratio: 12 (ref 10–24)
BUN: 14 mg/dL (ref 8–27)
CHLORIDE: 102 mmol/L (ref 96–106)
CO2: 24 mmol/L (ref 18–29)
Calcium: 9.8 mg/dL (ref 8.6–10.2)
Creatinine, Ser: 1.2 mg/dL (ref 0.76–1.27)
GFR calc non Af Amer: 54 mL/min/{1.73_m2} — ABNORMAL LOW (ref >59)
GFR, EST AFRICAN AMERICAN: 62 mL/min/{1.73_m2} (ref >59)
GLOBULIN, TOTAL: 2.3 g/dL (ref 1.5–4.5)
Glucose: 103 mg/dL — ABNORMAL HIGH (ref 65–99)
POTASSIUM: 5 mmol/L (ref 3.5–5.2)
SODIUM: 141 mmol/L (ref 134–144)
TOTAL PROTEIN: 6.6 g/dL (ref 6.0–8.5)

## 2015-11-26 LAB — LIPID PANEL
Chol/HDL Ratio: 2.7 ratio units (ref 0.0–5.0)
Cholesterol, Total: 130 mg/dL (ref 100–199)
HDL: 49 mg/dL (ref >39)
LDL Calculated: 60 mg/dL (ref 0–99)
Triglycerides: 107 mg/dL (ref 0–149)
VLDL Cholesterol Cal: 21 mg/dL (ref 5–40)

## 2015-12-10 ENCOUNTER — Other Ambulatory Visit: Payer: Self-pay | Admitting: Nurse Practitioner

## 2015-12-16 ENCOUNTER — Other Ambulatory Visit: Payer: Self-pay | Admitting: Nurse Practitioner

## 2015-12-16 MED ORDER — TIZANIDINE HCL 4 MG PO TABS: ORAL_TABLET | ORAL | 1 refills | Status: DC

## 2015-12-16 MED ORDER — HYDROCODONE-ACETAMINOPHEN 7.5-325 MG PO TABS: 1.0000 | ORAL_TABLET | Freq: Four times a day (QID) | ORAL | 0 refills | Status: DC | PRN

## 2015-12-16 NOTE — Telephone Encounter (Signed)
Left a detailed message stating rx is ready for pick up and when you come in to pick up rx to schedule appt for pain contract.

## 2015-12-19 ENCOUNTER — Other Ambulatory Visit: Payer: Self-pay | Admitting: Nurse Practitioner
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
## 2015-12-30 ENCOUNTER — Telehealth: Payer: Self-pay | Admitting: Nurse Practitioner

## 2015-12-30 NOTE — Telephone Encounter (Signed)
Patient of [facility]. Please advise 

## 2016-01-02 NOTE — Telephone Encounter (Signed)
Pt notified of recommendation Verbalizes understanding Will call back to schedule

## 2016-01-12 ENCOUNTER — Telehealth: Payer: Self-pay | Admitting: Nurse Practitioner

## 2016-01-12 NOTE — Telephone Encounter (Signed)
Pt needed appts with [facility] for himself and wife appts scheduled Pt notified

## 2016-01-18 ENCOUNTER — Other Ambulatory Visit: Payer: Self-pay | Admitting: Family Medicine

## 2016-01-19 ENCOUNTER — Ambulatory Visit (INDEPENDENT_AMBULATORY_CARE_PROVIDER_SITE_OTHER): Payer: Medicare Other | Admitting: Nurse Practitioner

## 2016-01-19 ENCOUNTER — Encounter: Payer: Self-pay | Admitting: Nurse Practitioner

## 2016-01-19 VITALS — BP 141/78 | HR 69 | Temp 99.5°F | Ht 65.0 in | Wt 185.0 lb

## 2016-01-19 DIAGNOSIS — E538 Deficiency of other specified B group vitamins: Secondary | ICD-10-CM | POA: Diagnosis not present

## 2016-01-19 DIAGNOSIS — Z79891 Long term (current) use of opiate analgesic: Secondary | ICD-10-CM

## 2016-01-19 DIAGNOSIS — G8929 Other chronic pain: Secondary | ICD-10-CM

## 2016-01-19 DIAGNOSIS — M542 Cervicalgia: Secondary | ICD-10-CM

## 2016-01-19 MED ORDER — HYDROCODONE-ACETAMINOPHEN 7.5-325 MG PO TABS: 1.0000 | ORAL_TABLET | Freq: Four times a day (QID) | ORAL | 0 refills | Status: AC | PRN
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
## 2016-01-30 ENCOUNTER — Telehealth: Payer: Self-pay | Admitting: Nurse Practitioner

## 2016-01-30 ENCOUNTER — Other Ambulatory Visit: Payer: Self-pay | Admitting: Nurse Practitioner

## 2016-01-30 NOTE — Telephone Encounter (Signed)
Patient aware that samples are ready for pick up 

## 2016-02-02 ENCOUNTER — Ambulatory Visit (INDEPENDENT_AMBULATORY_CARE_PROVIDER_SITE_OTHER): Payer: Medicare Other | Admitting: Nurse Practitioner

## 2016-02-02 ENCOUNTER — Encounter: Payer: Self-pay | Admitting: Nurse Practitioner

## 2016-02-02 VITALS — BP 151/82 | HR 82 | Temp 98.4°F | Ht 65.0 in | Wt 181.0 lb

## 2016-02-02 DIAGNOSIS — J0101 Acute recurrent maxillary sinusitis: Secondary | ICD-10-CM

## 2016-02-02 MED ORDER — AZITHROMYCIN 250 MG PO TABS: ORAL_TABLET | ORAL | 0 refills | Status: DC

## 2016-02-02 NOTE — Patient Instructions (Signed)

## 2016-02-02 NOTE — Progress Notes (Signed)
Subjective:     Collin Campbell is a 82 y.o. male who presents for evaluation of sinus pain. Symptoms include: congestion, cough, headaches, nasal congestion and sinus pressure. Onset of symptoms was 2 weeks ago. Symptoms have been gradually worsening since that time. Past history is significant for occasional episodes of bronchitis, chronic bronchitis and pneumonia. Patient is a non-smoker.  The following portions of the patient's history were reviewed and updated as appropriate: allergies, current medications, past family history, past medical history, past social history, past surgical history and problem list.  Review of Systems Pertinent items noted in HPI and remainder of comprehensive ROS otherwise negative.   Objective:    BP (!) 151/82   Pulse 82   Temp 98.4 F (36.9 C) (Oral)   Ht 5\' 5"  (1.651 m)   Wt 181 lb (82.1 kg)   BMI 30.12 kg/m  General appearance: alert and cooperative Eyes: conjunctivae/corneas clear. PERRL, EOM's intact. Fundi benign. Ears: normal TM's and external ear canals both ears Nose: clear discharge, moderate congestion, turbinates red, no sinus tenderness Throat: lips, mucosa, and tongue normal; teeth and gums normal Neck: no adenopathy, no carotid bruit, no JVD, supple, symmetrical, trachea midline and thyroid not enlarged, symmetric, no tenderness/mass/nodules Lungs: clear to auscultation bilaterally and deep wet cough Heart: regular rate and rhythm, S1, S2 normal, no murmur, click, rub or gallop    Assessment:    Acute bacterial sinusitis.    Plan:   1. Take meds as prescribed 2. Use a cool mist humidifier especially during the winter months and when heat has been humid. 3. Use saline nose sprays frequently 4. Saline irrigations of the nose can be very helpful if done frequently.  * 4X daily for 1 week*  * Use of a nettie pot can be helpful with this. Follow directions with this* 5. Drink plenty of fluids 6. Keep thermostat turn down  low 7.For any cough or congestion  Use plain Mucinex- regular strength or max strength is fine   * Children- consult with Pharmacist for dosing 8. For fever or aces or pains- take tylenol or ibuprofen appropriate for age and weight.  * for fevers greater than 101 orally you may alternate ibuprofen and tylenol every  3 hours.   Meds ordered this encounter  Medications  . azithromycin (ZITHROMAX) 250 MG tablet    Sig: Two tablets day one, then one tablet daily next 4 days.    Dispense:  6 tablet    Refill:  0    Order Specific Question:   Supervising Provider    Answer:   Eustaquio Maize [[id number]]   Mary-Margaret Hassell Done, FNP

## 2016-02-14 ENCOUNTER — Telehealth: Payer: Self-pay | Admitting: Nurse Practitioner

## 2016-02-14 NOTE — Telephone Encounter (Signed)
No he just had a z pak- cannot keep getting z pak- he has had to many lately and they will not work if he takes that often.

## 2016-02-14 NOTE — Telephone Encounter (Signed)
Please review and advise.

## 2016-02-20 ENCOUNTER — Ambulatory Visit: Payer: Medicare Other

## 2016-03-15 ENCOUNTER — Ambulatory Visit (INDEPENDENT_AMBULATORY_CARE_PROVIDER_SITE_OTHER): Payer: Medicare Other

## 2016-03-15 DIAGNOSIS — Z23 Encounter for immunization: Secondary | ICD-10-CM | POA: Diagnosis not present

## 2016-03-15 DIAGNOSIS — E538 Deficiency of other specified B group vitamins: Secondary | ICD-10-CM

## 2016-03-20 ENCOUNTER — Telehealth: Payer: Self-pay | Admitting: Nurse Practitioner

## 2016-03-29 ENCOUNTER — Telehealth: Payer: Self-pay | Admitting: Nurse Practitioner

## 2016-03-29 NOTE — Telephone Encounter (Signed)
Pt has cough and congestion, no fever, has HA Using mucinex   Wants antibiotic

## 2016-03-30 NOTE — Telephone Encounter (Signed)
Does not need antibiotic if does not have a fever- will just run its course

## 2016-03-30 NOTE — Telephone Encounter (Signed)
Patient reports he has no fever at this time, will continue Mucinex.  Patient aware.

## 2016-04-14 ENCOUNTER — Telehealth: Payer: Self-pay | Admitting: Nurse Practitioner

## 2016-04-16 NOTE — Telephone Encounter (Signed)
There is no referral to the wound clinic on this pt

## 2016-04-16 NOTE — Telephone Encounter (Signed)
It is not for him- it is for his wife Collin Campbell- home health is suppose to be coming out for wound care- gina was working on it.

## 2016-04-27 ENCOUNTER — Other Ambulatory Visit: Payer: Self-pay | Admitting: Nurse Practitioner

## 2016-04-27 DIAGNOSIS — E785 Hyperlipidemia, unspecified: Secondary | ICD-10-CM

## 2016-04-27 DIAGNOSIS — N400 Enlarged prostate without lower urinary tract symptoms: Secondary | ICD-10-CM

## 2016-04-27 DIAGNOSIS — K219 Gastro-esophageal reflux disease without esophagitis: Secondary | ICD-10-CM

## 2016-05-17 ENCOUNTER — Encounter: Payer: Self-pay | Admitting: Family Medicine

## 2016-05-17 ENCOUNTER — Ambulatory Visit (INDEPENDENT_AMBULATORY_CARE_PROVIDER_SITE_OTHER): Payer: Medicare Other | Admitting: Family Medicine

## 2016-05-17 VITALS — BP 130/59 | HR 67 | Temp 98.1°F | Ht 65.0 in | Wt 184.0 lb

## 2016-05-17 DIAGNOSIS — J209 Acute bronchitis, unspecified: Secondary | ICD-10-CM | POA: Diagnosis not present

## 2016-05-17 MED ORDER — DOXYCYCLINE HYCLATE 100 MG PO TABS: 100.0000 mg | ORAL_TABLET | Freq: Two times a day (BID) | ORAL | 0 refills | Status: DC

## 2016-05-17 MED ORDER — HYDROCODONE-HOMATROPINE 5-1.5 MG/5ML PO SYRP: 5.0000 mL | ORAL_SOLUTION | Freq: Every evening | ORAL | 0 refills | Status: DC | PRN

## 2016-05-17 NOTE — Progress Notes (Signed)
Subjective:    Patient ID: Collin Campbell, male    DOB: August 23, 1925, 83 y.o.   MRN: [id number]  HPI Patient here today for congestion and eye irritation.Cough and nasal drainage or productive of yellow sputum. He complains of some pressure in the sinuses.     Patient Active Problem List   Diagnosis Date Noted  . Essential hypertension, benign 07/29/2015  . BMI 30.0-30.9,adult 02/22/2015  . BPH (benign prostatic hyperplasia) 01/21/2014  . COPD bronchitis 01/21/2014  . Vitamin B12 deficiency 04/22/2013  . Fatigue 04/22/2013  . Leukopenia 04/01/2013  . Unspecified deficiency anemia   . S/P left TKA 02/20/2012  . Chronic kidney disease 08/14/2011  . Hyperlipidemia 09/04/2010  . ARTHRITIS 09/22/2009  . Diabetes ([place]) 08/31/2009  . CHRONIC ANGLE-CLOSURE GLAUCOMA 08/31/2009  . Coronary atherosclerosis 08/31/2009  . SLEEP APNEA 08/31/2009   Outpatient Encounter Prescriptions as of 05/17/2016  Medication Sig  . ACCU-CHEK AVIVA PLUS test strip Check glucose 4 times daily  . aspirin 325 MG tablet Take 325 mg by mouth daily.  . budesonide-formoterol (SYMBICORT) 80-4.5 MCG/ACT inhaler Inhale 1 puff into the lungs 2 (two) times daily.  . Cholecalciferol (VITAMIN D3) 2000 UNITS TABS Take 1 tablet by mouth daily.   . cloNIDine (CATAPRES) 0.3 MG tablet Take 1 tablet by mouth  twice a day  . cyanocobalamin (,VITAMIN B-12,) 1000 MCG/ML injection Inject 1 mL (1,000 mcg total) into the muscle every 30 (thirty) days.  Marland Kitchen docusate sodium (COLACE) 100 MG capsule Take 100 mg by mouth 2 (two) times daily.  . fenofibrate 160 MG tablet TAKE 1 TABLET BY MOUTH  DAILY  . ferrous sulfate 325 (65 FE) MG tablet TAKE 2 TABLETS TWO TIMES DAILY WITH A MEAL.  Marland Kitchen HYDROcodone-acetaminophen (NORCO) 7.5-325 MG tablet Take 1 tablet by mouth every 6 (six) hours as needed for moderate pain.  . Lancets (ACCU-CHEK MULTICLIX) lancets Check glucose 4 times daily  . loratadine (CLARITIN) 10 MG tablet Take 10 mg by mouth at  bedtime.  Marland Kitchen losartan-hydrochlorothiazide (HYZAAR) 100-25 MG tablet TAKE 1 TABLET EVERY DAY  WITH BREAKFAST  . metFORMIN (GLUCOPHAGE) 500 MG tablet TAKE 1 TABLET BY MOUTH  TWICE A DAY  . Multiple Vitamin (MULTIVITAMIN WITH MINERALS) TABS Take 0.5 tablets by mouth 2 (two) times daily.  . naproxen (NAPROSYN) 500 MG tablet Take 1 tablet (500 mg total) by mouth 2 (two) times daily with a meal.  . NIFEdipine (PROCARDIA-XL/ADALAT-CC/NIFEDICAL-XL) 30 MG 24 hr tablet Take 1 tablet by mouth  daily  . pantoprazole (PROTONIX) 40 MG tablet TAKE 1 TABLET BY MOUTH  DAILY WITH BREAKFAST  . Probiotic Product (PROBIOTIC DAILY) CAPS Take 1 capsule by mouth at bedtime.  . simvastatin (ZOCOR) 40 MG tablet TAKE 1 TABLET BY MOUTH AT  BEDTIME  . tamsulosin (FLOMAX) 0.4 MG CAPS capsule TAKE 1 CAPSULE BY MOUTH  EVERY DAY  . tiZANidine (ZANAFLEX) 4 MG tablet Take 1 tablet by mouth  every 6 hours as needed for muscle spasm(s)  . Zinc 50 MG CAPS Take 1 capsule by mouth daily with breakfast.  . albuterol (PROVENTIL HFA;VENTOLIN HFA) 108 (90 BASE) MCG/ACT inhaler Inhale 1-2 puffs into the lungs every 6 (six) hours as needed for wheezing or shortness of breath. (Patient not taking: Reported on 05/17/2016)  . glycerin adult 2 G SUPP Place 1 suppository rectally once as needed for moderate constipation.  Marland Kitchen HYDROcodone-acetaminophen (NORCO) 7.5-325 MG tablet Take 1 tablet by mouth every 6 (six) hours as needed for moderate pain. (Patient  not taking: Reported on 05/17/2016)  . HYDROcodone-acetaminophen (NORCO) 7.5-325 MG tablet Take 1 tablet by mouth every 6 (six) hours as needed for moderate pain. (Patient not taking: Reported on 05/17/2016)  . nitroGLYCERIN (NITROSTAT) 0.4 MG SL tablet Dissolve 1 tablet under the tongue every 5 minutes as  needed for chest pain (Patient not taking: Reported on 05/17/2016)  . Pramoxine-HC (HYDROCORTISONE ACE-PRAMOXINE) 2.5-1 % CREA Use on bilateral arms TID (Patient not taking: Reported on 05/17/2016)  .  [DISCONTINUED] azithromycin (ZITHROMAX) 250 MG tablet Two tablets day one, then one tablet daily next 4 days.  . [DISCONTINUED] magnesium hydroxide (MILK OF MAGNESIA) 400 MG/5ML suspension Take 5 mLs by mouth daily as needed for indigestion.   Facility-Administered Encounter Medications as of 05/17/2016  Medication  . cyanocobalamin ((VITAMIN B-12)) injection 1,000 mcg     Review of Systems  Constitutional: Negative.   HENT: Positive for congestion (yellow).   Eyes: Positive for redness and itching.  Respiratory: Negative.   Cardiovascular: Negative.   Gastrointestinal: Negative.   Endocrine: Negative.   Genitourinary: Negative.   Musculoskeletal: Negative.   Skin: Negative.   Allergic/Immunologic: Negative.   Neurological: Negative.   Hematological: Negative.   Psychiatric/Behavioral: Negative.        Objective:   Physical Exam  Constitutional: He is oriented to person, place, and time. He appears well-developed and well-nourished.  HENT:  Sinuses are nontender to percussion  Eyes: Conjunctivae and EOM are normal. Left eye exhibits discharge.  Do not see evidence of conjunctivitis. I think the drainage is related to the other congestion and drainage through the nasolacrimal duct  Pulmonary/Chest: Effort normal. He has wheezes.  Neurological: He is alert and oriented to person, place, and time.  Psychiatric: He has a normal mood and affect. His behavior is normal.   BP (!) 130/59 (BP Location: Right Arm)   Pulse 67   Temp 98.1 F (36.7 C) (Oral)   Ht 5\' 5"  (1.651 m)   Wt 184 lb (83.5 kg)   BMI 30.62 kg/m         Assessment & Plan:  1. Acute bronchitis, unspecified organism Treat with doxycycline 100 mg twice a day for 10 days. Also Hycodan as needed for nighttime cough. Declines treatment of his highs since I think is related to the other congestion rather than a infection  Wardell Honour MD

## 2016-06-27 ENCOUNTER — Telehealth: Payer: Self-pay | Admitting: Nurse Practitioner

## 2016-06-27 DIAGNOSIS — J40 Bronchitis, not specified as acute or chronic: Secondary | ICD-10-CM

## 2016-06-27 MED ORDER — AZITHROMYCIN 250 MG PO TABS: ORAL_TABLET | ORAL | 0 refills | Status: DC

## 2016-06-27 NOTE — Telephone Encounter (Signed)
Daughter states that patient has been having cough and congestion for 2 weeks and would like a zpac sent in. Daughter states [facility]MM normally does it for them. His wife was buried yesterday and patient is not in any shape to be seen. Covering PCP, please advise

## 2016-06-27 NOTE — Telephone Encounter (Signed)
Daughter aware that antibiotic sent to the pharmacy

## 2016-07-07 ENCOUNTER — Other Ambulatory Visit: Payer: Self-pay | Admitting: Nurse Practitioner

## 2016-07-09 ENCOUNTER — Other Ambulatory Visit: Payer: Self-pay | Admitting: *Deleted

## 2016-07-09 MED ORDER — METFORMIN HCL 500 MG PO TABS: 500.0000 mg | ORAL_TABLET | Freq: Two times a day (BID) | ORAL | 1 refills | Status: AC

## 2016-07-09 MED ORDER — GLUCOSE BLOOD VI STRP: ORAL_STRIP | 1 refills | Status: AC

## 2016-08-25 ENCOUNTER — Other Ambulatory Visit: Payer: Self-pay | Admitting: Nurse Practitioner

## 2016-08-25 DIAGNOSIS — N400 Enlarged prostate without lower urinary tract symptoms: Secondary | ICD-10-CM

## 2016-08-25 DIAGNOSIS — E785 Hyperlipidemia, unspecified: Secondary | ICD-10-CM

## 2016-08-29 ENCOUNTER — Ambulatory Visit (INDEPENDENT_AMBULATORY_CARE_PROVIDER_SITE_OTHER): Payer: Medicare Other | Admitting: Physician Assistant

## 2016-08-29 ENCOUNTER — Ambulatory Visit: Payer: Medicare Other | Admitting: Family

## 2016-08-29 ENCOUNTER — Encounter: Payer: Self-pay | Admitting: Physician Assistant

## 2016-08-29 VITALS — Temp 99.4°F | Ht 65.0 in | Wt 181.0 lb

## 2016-08-29 DIAGNOSIS — H6121 Impacted cerumen, right ear: Secondary | ICD-10-CM

## 2016-08-29 DIAGNOSIS — M5030 Other cervical disc degeneration, unspecified cervical region: Secondary | ICD-10-CM | POA: Diagnosis not present

## 2016-08-29 DIAGNOSIS — E538 Deficiency of other specified B group vitamins: Secondary | ICD-10-CM | POA: Diagnosis not present

## 2016-08-29 MED ORDER — CYCLOBENZAPRINE HCL 5 MG PO TABS: 5.0000 mg | ORAL_TABLET | Freq: Three times a day (TID) | ORAL | 0 refills | Status: AC | PRN

## 2016-08-29 NOTE — Patient Instructions (Signed)
Earwax Buildup Your ears make a substance called earwax. It may also be called cerumen. Sometimes, too much earwax builds up in your ear canal. This can cause ear pain and make it harder for you to hear. CAUSES This condition is caused by too much earwax production or buildup. RISK FACTORS The following factors may make you more likely to develop this condition:  Cleaning your ears often with swabs.  Having narrow ear canals.  Having earwax that is overly thick or sticky.  Having eczema.  Being dehydrated. SYMPTOMS Symptoms of this condition include:  Reduced hearing.  Ear drainage.  Ear pain.  Ear itch.  A feeling of fullness in the ear or feeling that the ear is plugged.  Ringing in the ear.  Coughing. DIAGNOSIS Your health care provider can diagnose this condition based on your symptoms and medical history. Your health care provider will also do an ear exam to look inside your ear with a scope (otoscope). You may also have a hearing test. TREATMENT Treatment for this condition includes:  Over-the-counter or prescription ear drops to soften the earwax.  Earwax removal by a health care provider. This may be done:  By flushing the ear with body-temperature water.  With a medical instrument that has a loop at the end (earwax curette).  With a suction device. HOME CARE INSTRUCTIONS  Take over-the-counter and prescription medicines only as told by your health care provider.  Do not put any objects, including an ear swab, into your ear. You can clean the opening of your ear canal with a washcloth.  Drink enough water to keep your urine clear or pale yellow.  If you have frequent earwax buildup or you use hearing aids, consider seeing your health care provider every 6-12 months for routine preventive ear cleanings. Keep all follow-up visits as told by your health care provider. SEEK MEDICAL CARE IF:  You have ear pain.  Your condition does not improve with  treatment.  You have hearing loss.  You have blood, pus, or other fluid coming from your ear. This information is not intended to replace advice given to you by your health care provider. Make sure you discuss any questions you have with your health care provider. Document Released: 06/07/2004 Document Revised: 08/22/2015 Document Reviewed: 12/15/2014 Elsevier Interactive Patient Education  2017 [employer].

## 2016-09-02 DIAGNOSIS — H6121 Impacted cerumen, right ear: Secondary | ICD-10-CM | POA: Insufficient documentation

## 2016-09-02 DIAGNOSIS — M5030 Other cervical disc degeneration, unspecified cervical region: Secondary | ICD-10-CM | POA: Insufficient documentation

## 2016-09-02 NOTE — Progress Notes (Signed)
Temp 99.4 F (37.4 C) (Oral)   Ht '5\' 5"'  (1.651 m)   Wt 181 lb (82.1 kg)   BMI 30.12 kg/m    Subjective:    Patient ID: Collin Campbell, male    DOB: [birth year], 83 y.o.   MRN: [id number]  HPI: Collin Campbell is a 83 y.o. male presenting on 08/29/2016 for right ear pain  He got a tip of the qtip stuck in his right ear, has been trying to get it out for a week. Denies blood or pus.  He is also having more neck pain and wants his flexeril refilled at his mail in pharmacy.  Reminded him to come and get a recheck with his PCP for chronic pain medications.  Relevant past medical, surgical, family and social history reviewed and updated as indicated. Allergies and medications reviewed and updated.  Past Medical History:  Diagnosis Date  . Anemia   . Arthritis   . Asthma   . BPH (benign prostatic hyperplasia)   . CAD (coronary artery disease) 1999   PCI with stent  . Chronic kidney disease 08/14/11   OV  Dr Jeffie Pollock with recommendations on chart/ BPH  . COPD (chronic obstructive pulmonary disease) ([place])   . Diabetes mellitus   . Dysrhythmia    "irregular after surgery"  . Fatigue 04/22/2013  . GERD (gastroesophageal reflux disease)   . Glaucoma   . H/O hiatal hernia   . Headache(784.0)    migraines years ago  . Hypercholesterolemia   . Hypertension    EKG 10/18/11, chest 6/13 on chart  . Leukopenia 04/01/2013  . Peptic ulcer disease   . Pneumonia    x 2 years ago  . Shortness of breath    with exertion  . Sleep apnea 02-15-12   STOP BANG SCORE 4  . Unspecified deficiency anemia   . Vitamin B12 deficiency 04/22/2013    Past Surgical History:  Procedure Laterality Date  . COLONOSCOPY  2012   neg  . CORONARY ANGIOPLASTY  12/06/1999   x1 mid LAD/ no current cardiologist  . ESOPHAGOGASTRODUODENOSCOPY  2011   neg  . EYE SURGERY     bilateral cataract extraction with IOL  . NASAL POLYP SURGERY    . TOTAL KNEE ARTHROPLASTY  10/30/2011   Procedure: TOTAL KNEE ARTHROPLASTY;   Surgeon: Mauri Pole, MD;  Location: [facility];  Service: Orthopedics;  Laterality: Right;  . TOTAL KNEE ARTHROPLASTY  02/19/2012   Procedure: TOTAL KNEE ARTHROPLASTY;  Surgeon: Mauri Pole, MD;  Location: [facility];  Service: Orthopedics;  Laterality: Left;  . TRANSURETHRAL RESECTION OF PROSTATE      Review of Systems  Constitutional: Negative.  Negative for appetite change and fatigue.  HENT: Positive for ear pain. Negative for ear discharge, facial swelling and hearing loss.   Eyes: Negative.  Negative for pain and visual disturbance.  Respiratory: Negative.  Negative for cough, chest tightness, shortness of breath and wheezing.   Cardiovascular: Negative.  Negative for chest pain, palpitations and leg swelling.  Gastrointestinal: Negative.  Negative for abdominal pain, diarrhea, nausea and vomiting.  Endocrine: Negative.   Genitourinary: Negative.   Musculoskeletal: Positive for arthralgias, back pain, neck pain and neck stiffness.  Skin: Negative.  Negative for color change and rash.  Neurological: Negative.  Negative for weakness, numbness and headaches.  Psychiatric/Behavioral: Negative.     Allergies as of 08/29/2016      Reactions   Sulfonamide Derivatives Swelling, Rash, Other (See Comments)  Feet feels like like on needles      Medication List       Accurate as of 08/29/16 11:59 PM. Always use your most recent med list.          accu-chek multiclix lancets Check glucose 4 times daily   albuterol 108 (90 Base) MCG/ACT inhaler Commonly known as:  PROVENTIL HFA;VENTOLIN HFA Inhale 1-2 puffs into the lungs every 6 (six) hours as needed for wheezing or shortness of breath.   aspirin 325 MG tablet Take 325 mg by mouth daily.   budesonide-formoterol 80-4.5 MCG/ACT inhaler Commonly known as:  SYMBICORT Inhale 1 puff into the lungs 2 (two) times daily.   cloNIDine 0.3 MG tablet Commonly known as:  CATAPRES Take 1 tablet by mouth  twice a day   cyanocobalamin 1000  MCG/ML injection Commonly known as:  (VITAMIN B-12) Inject 1 mL (1,000 mcg total) into the muscle every 30 (thirty) days.   cyclobenzaprine 5 MG tablet Commonly known as:  FLEXERIL Take 1 tablet (5 mg total) by mouth 3 (three) times daily as needed for muscle spasms.   docusate sodium 100 MG capsule Commonly known as:  COLACE Take 100 mg by mouth 2 (two) times daily.   fenofibrate 160 MG tablet TAKE 1 TABLET BY MOUTH  DAILY   ferrous sulfate 325 (65 FE) MG tablet TAKE 2 TABLETS TWO TIMES DAILY WITH A MEAL.   glucose blood test strip Commonly known as:  ACCU-CHEK AVIVA PLUS Check glucose 4 times daily   glycerin adult 2 g Supp Place 1 suppository rectally once as needed for moderate constipation.   HYDROcodone-acetaminophen 7.5-325 MG tablet Commonly known as:  NORCO Take 1 tablet by mouth every 6 (six) hours as needed for moderate pain.   HYDROcodone-acetaminophen 7.5-325 MG tablet Commonly known as:  NORCO Take 1 tablet by mouth every 6 (six) hours as needed for moderate pain.   HYDROcodone-acetaminophen 7.5-325 MG tablet Commonly known as:  NORCO Take 1 tablet by mouth every 6 (six) hours as needed for moderate pain.   Hydrocortisone Ace-Pramoxine 2.5-1 % Crea Use on bilateral arms TID   loratadine 10 MG tablet Commonly known as:  CLARITIN Take 10 mg by mouth at bedtime.   losartan-hydrochlorothiazide 100-25 MG tablet Commonly known as:  HYZAAR TAKE 1 TABLET EVERY DAY  WITH BREAKFAST   metFORMIN 500 MG tablet Commonly known as:  GLUCOPHAGE Take 1 tablet (500 mg total) by mouth 2 (two) times daily.   multivitamin with minerals Tabs tablet Take 0.5 tablets by mouth 2 (two) times daily.   naproxen 500 MG tablet Commonly known as:  NAPROSYN Take 1 tablet (500 mg total) by mouth 2 (two) times daily with a meal.   NIFEdipine 30 MG 24 hr tablet Commonly known as:  PROCARDIA-XL/ADALAT-CC/NIFEDICAL-XL Take 1 tablet by mouth  daily   nitroGLYCERIN 0.4 MG SL  tablet Commonly known as:  NITROSTAT Dissolve 1 tablet under the tongue every 5 minutes as  needed for chest pain   pantoprazole 40 MG tablet Commonly known as:  PROTONIX TAKE 1 TABLET BY MOUTH  DAILY WITH BREAKFAST   PROBIOTIC DAILY Caps Take 1 capsule by mouth at bedtime.   simvastatin 40 MG tablet Commonly known as:  ZOCOR TAKE 1 TABLET BY MOUTH AT  BEDTIME   tamsulosin 0.4 MG Caps capsule Commonly known as:  FLOMAX TAKE 1 CAPSULE BY MOUTH  EVERY DAY   Vitamin D3 2000 units Tabs Take 1 tablet by mouth daily.   Zinc  50 MG Caps Take 1 capsule by mouth daily with breakfast.          Objective:    Temp 99.4 F (37.4 C) (Oral)   Ht '5\' 5"'  (1.651 m)   Wt 181 lb (82.1 kg)   BMI 30.12 kg/m   Allergies  Allergen Reactions  . Sulfonamide Derivatives Swelling, Rash and Other (See Comments)    Feet feels like like on needles    Physical Exam  Constitutional: He appears well-developed and well-nourished. No distress.  HENT:  Head: Normocephalic and atraumatic.  Right Ear: No drainage. Tympanic membrane is not erythematous. No decreased hearing is noted.  qtip in canal with wax  Eyes: Conjunctivae and EOM are normal. Pupils are equal, round, and reactive to light.  Cardiovascular: Normal rate, regular rhythm and normal heart sounds.   Pulmonary/Chest: Effort normal and breath sounds normal. No respiratory distress.  Skin: Skin is warm and dry.  Psychiatric: He has a normal mood and affect. His behavior is normal.  Nursing note and vitals reviewed.   Results for orders placed or performed in visit on 11/25/15  Bayer DCA Hb A1c Waived  Result Value Ref Range   Bayer DCA Hb A1c Waived 6.3 <7.0 %  CMP14+EGFR  Result Value Ref Range   Glucose 103 (H) 65 - 99 mg/dL   BUN 14 8 - 27 mg/dL   Creatinine, Ser 1.20 0.76 - 1.27 mg/dL   GFR calc non Af Amer 54 (L) >59 mL/min/1.73   GFR calc Af Amer 62 >59 mL/min/1.73   BUN/Creatinine Ratio 12 10 - 24   Sodium 141 134 - 144  mmol/L   Potassium 5.0 3.5 - 5.2 mmol/L   Chloride 102 96 - 106 mmol/L   CO2 24 18 - 29 mmol/L   Calcium 9.8 8.6 - 10.2 mg/dL   Total Protein 6.6 6.0 - 8.5 g/dL   Albumin 4.3 3.5 - 4.7 g/dL   Globulin, Total 2.3 1.5 - 4.5 g/dL   Albumin/Globulin Ratio 1.9 1.2 - 2.2   Bilirubin Total 0.4 0.0 - 1.2 mg/dL   Alkaline Phosphatase 33 (L) 39 - 117 IU/L   AST 28 0 - 40 IU/L   ALT 14 0 - 44 IU/L  Lipid panel  Result Value Ref Range   Cholesterol, Total 130 100 - 199 mg/dL   Triglycerides 107 0 - 149 mg/dL   HDL 49 >39 mg/dL   VLDL Cholesterol Cal 21 5 - 40 mg/dL   LDL Calculated 60 0 - 99 mg/dL   Chol/HDL Ratio 2.7 0.0 - 5.0 ratio units      Assessment & Plan:   1. Impacted cerumen of right ear Ear irrigation achieved removal of cotton off a qtip.  2. DDD (degenerative disc disease), cervical - cyclobenzaprine (FLEXERIL) 5 MG tablet; Take 1 tablet (5 mg total) by mouth 3 (three) times daily as needed for muscle spasms.  Dispense: 270 tablet; Refill: 0   Current Outpatient Prescriptions:  .  albuterol (PROVENTIL HFA;VENTOLIN HFA) 108 (90 BASE) MCG/ACT inhaler, Inhale 1-2 puffs into the lungs every 6 (six) hours as needed for wheezing or shortness of breath., Disp: 18 g, Rfl: 1 .  aspirin 325 MG tablet, Take 325 mg by mouth daily., Disp: , Rfl:  .  budesonide-formoterol (SYMBICORT) 80-4.5 MCG/ACT inhaler, Inhale 1 puff into the lungs 2 (two) times daily., Disp: 1 Inhaler, Rfl: 5 .  Cholecalciferol (VITAMIN D3) 2000 UNITS TABS, Take 1 tablet by mouth daily. , Disp: , Rfl:  .  cloNIDine (CATAPRES) 0.3 MG tablet, Take 1 tablet by mouth  twice a day, Disp: 180 tablet, Rfl: 5 .  cyanocobalamin (,VITAMIN B-12,) 1000 MCG/ML injection, Inject 1 mL (1,000 mcg total) into the muscle every 30 (thirty) days., Disp: 1 mL, Rfl: 12 .  docusate sodium (COLACE) 100 MG capsule, Take 100 mg by mouth 2 (two) times daily., Disp: , Rfl:  .  fenofibrate 160 MG tablet, TAKE 1 TABLET BY MOUTH  DAILY, Disp: 90  tablet, Rfl: 1 .  ferrous sulfate 325 (65 FE) MG tablet, TAKE 2 TABLETS TWO TIMES DAILY WITH A MEAL., Disp: 360 tablet, Rfl: 1 .  glucose blood (ACCU-CHEK AVIVA PLUS) test strip, Check glucose 4 times daily, Disp: 120 each, Rfl: 1 .  glycerin adult 2 G SUPP, Place 1 suppository rectally once as needed for moderate constipation., Disp: , Rfl:  .  HYDROcodone-acetaminophen (NORCO) 7.5-325 MG tablet, Take 1 tablet by mouth every 6 (six) hours as needed for moderate pain., Disp: 60 tablet, Rfl: 0 .  Lancets (ACCU-CHEK MULTICLIX) lancets, Check glucose 4 times daily, Disp: 408 each, Rfl: 2 .  loratadine (CLARITIN) 10 MG tablet, Take 10 mg by mouth at bedtime., Disp: , Rfl:  .  losartan-hydrochlorothiazide (HYZAAR) 100-25 MG tablet, TAKE 1 TABLET EVERY DAY  WITH BREAKFAST, Disp: 90 tablet, Rfl: 3 .  metFORMIN (GLUCOPHAGE) 500 MG tablet, Take 1 tablet (500 mg total) by mouth 2 (two) times daily., Disp: 180 tablet, Rfl: 1 .  Multiple Vitamin (MULTIVITAMIN WITH MINERALS) TABS, Take 0.5 tablets by mouth 2 (two) times daily., Disp: , Rfl:  .  naproxen (NAPROSYN) 500 MG tablet, Take 1 tablet (500 mg total) by mouth 2 (two) times daily with a meal., Disp: 30 tablet, Rfl: 0 .  NIFEdipine (PROCARDIA-XL/ADALAT-CC/NIFEDICAL-XL) 30 MG 24 hr tablet, Take 1 tablet by mouth  daily, Disp: 90 tablet, Rfl: 3 .  pantoprazole (PROTONIX) 40 MG tablet, TAKE 1 TABLET BY MOUTH  DAILY WITH BREAKFAST, Disp: 90 tablet, Rfl: 0 .  Pramoxine-HC (HYDROCORTISONE ACE-PRAMOXINE) 2.5-1 % CREA, Use on bilateral arms TID, Disp: 57 g, Rfl: 1 .  Probiotic Product (PROBIOTIC DAILY) CAPS, Take 1 capsule by mouth at bedtime., Disp: , Rfl:  .  simvastatin (ZOCOR) 40 MG tablet, TAKE 1 TABLET BY MOUTH AT  BEDTIME, Disp: 90 tablet, Rfl: 1 .  tamsulosin (FLOMAX) 0.4 MG CAPS capsule, TAKE 1 CAPSULE BY MOUTH  EVERY DAY, Disp: 90 capsule, Rfl: 1 .  Zinc 50 MG CAPS, Take 1 capsule by mouth daily with breakfast., Disp: , Rfl:  .  cyclobenzaprine  (FLEXERIL) 5 MG tablet, Take 1 tablet (5 mg total) by mouth 3 (three) times daily as needed for muscle spasms., Disp: 270 tablet, Rfl: 0 .  HYDROcodone-acetaminophen (NORCO) 7.5-325 MG tablet, Take 1 tablet by mouth every 6 (six) hours as needed for moderate pain. (Patient not taking: Reported on 05/17/2016), Disp: 60 tablet, Rfl: 0 .  HYDROcodone-acetaminophen (NORCO) 7.5-325 MG tablet, Take 1 tablet by mouth every 6 (six) hours as needed for moderate pain. (Patient not taking: Reported on 05/17/2016), Disp: 60 tablet, Rfl: 0 .  nitroGLYCERIN (NITROSTAT) 0.4 MG SL tablet, Dissolve 1 tablet under the tongue every 5 minutes as  needed for chest pain (Patient not taking: Reported on 08/29/2016), Disp: 25 tablet, Rfl: 2  Current Facility-Administered Medications:  .  cyanocobalamin ((VITAMIN B-12)) injection 1,000 mcg, 1,000 mcg, Intramuscular, Q30 days, Mary-Margaret Martin, FNP, 1,000 mcg at 08/29/16 1740  Continue all other maintenance medications as listed above.  Follow up plan: Return for 1 month appointment with Shelah Lewandowsky.  Educational handout given for earwax buildup  Terald Sleeper PA-C [place] [address]  [place], [place] [address] [phone]   09/02/2016, 4:32 PM

## 2016-09-17 ENCOUNTER — Telehealth: Payer: Self-pay | Admitting: Nurse Practitioner

## 2016-09-22 ENCOUNTER — Other Ambulatory Visit: Payer: Self-pay | Admitting: Nurse Practitioner

## 2016-09-22 DIAGNOSIS — K219 Gastro-esophageal reflux disease without esophagitis: Secondary | ICD-10-CM

## 2016-10-12 DEATH — deceased
